# Patient Record
Sex: Female | Born: 1945
Health system: Southern US, Community
[De-identification: ages and names within clinical notes are randomized; demographics above are authoritative.]

## PROBLEM LIST (undated history)

## (undated) DIAGNOSIS — F32A Depression, unspecified: Secondary | ICD-10-CM

## (undated) DIAGNOSIS — I10 Essential (primary) hypertension: Secondary | ICD-10-CM

## (undated) DIAGNOSIS — M1990 Unspecified osteoarthritis, unspecified site: Secondary | ICD-10-CM

## (undated) DIAGNOSIS — R7303 Prediabetes: Secondary | ICD-10-CM

## (undated) DIAGNOSIS — L219 Seborrheic dermatitis, unspecified: Secondary | ICD-10-CM

## (undated) HISTORY — PX: TONSILLECTOMY: SUR1361

## (undated) HISTORY — PX: LUMBAR FUSION: SHX111

## (undated) HISTORY — DX: Essential (primary) hypertension: I10

## (undated) HISTORY — DX: Prediabetes: R73.03

---

## 1997-09-03 ENCOUNTER — Ambulatory Visit: Admission: RE | Admit: 1997-09-03 | Discharge: 1997-09-03 | Payer: Self-pay | Admitting: Family Medicine

## 2000-03-09 ENCOUNTER — Ambulatory Visit (HOSPITAL_BASED_OUTPATIENT_CLINIC_OR_DEPARTMENT_OTHER): Admission: RE | Admit: 2000-03-09 | Discharge: 2000-03-09 | Payer: Self-pay | Admitting: Orthopedic Surgery

## 2000-03-09 ENCOUNTER — Encounter (INDEPENDENT_AMBULATORY_CARE_PROVIDER_SITE_OTHER): Payer: Self-pay | Admitting: *Deleted

## 2000-08-03 ENCOUNTER — Encounter: Payer: Self-pay | Admitting: *Deleted

## 2000-08-03 ENCOUNTER — Encounter: Admission: RE | Admit: 2000-08-03 | Discharge: 2000-08-03 | Payer: Self-pay | Admitting: *Deleted

## 2000-12-21 ENCOUNTER — Ambulatory Visit (HOSPITAL_BASED_OUTPATIENT_CLINIC_OR_DEPARTMENT_OTHER): Admission: RE | Admit: 2000-12-21 | Discharge: 2000-12-21 | Payer: Self-pay | Admitting: *Deleted

## 2000-12-21 ENCOUNTER — Encounter (INDEPENDENT_AMBULATORY_CARE_PROVIDER_SITE_OTHER): Payer: Self-pay | Admitting: *Deleted

## 2001-11-21 ENCOUNTER — Encounter: Payer: Self-pay | Admitting: Orthopedic Surgery

## 2001-11-21 ENCOUNTER — Encounter: Admission: RE | Admit: 2001-11-21 | Discharge: 2001-11-21 | Payer: Self-pay | Admitting: Orthopedic Surgery

## 2001-11-22 ENCOUNTER — Encounter: Admission: RE | Admit: 2001-11-22 | Discharge: 2001-11-22 | Payer: Self-pay | Admitting: Family Medicine

## 2001-11-22 ENCOUNTER — Encounter: Payer: Self-pay | Admitting: Family Medicine

## 2001-12-06 ENCOUNTER — Encounter: Payer: Self-pay | Admitting: Orthopedic Surgery

## 2001-12-06 ENCOUNTER — Encounter: Admission: RE | Admit: 2001-12-06 | Discharge: 2001-12-06 | Payer: Self-pay | Admitting: Orthopedic Surgery

## 2001-12-13 ENCOUNTER — Ambulatory Visit (HOSPITAL_BASED_OUTPATIENT_CLINIC_OR_DEPARTMENT_OTHER): Admission: RE | Admit: 2001-12-13 | Discharge: 2001-12-13 | Payer: Self-pay | Admitting: Orthopedic Surgery

## 2001-12-23 ENCOUNTER — Encounter: Admission: RE | Admit: 2001-12-23 | Discharge: 2001-12-23 | Payer: Self-pay | Admitting: Orthopedic Surgery

## 2001-12-23 ENCOUNTER — Encounter: Payer: Self-pay | Admitting: Orthopedic Surgery

## 2002-07-07 ENCOUNTER — Encounter: Admission: RE | Admit: 2002-07-07 | Discharge: 2002-07-07 | Payer: Self-pay | Admitting: Family Medicine

## 2002-07-07 ENCOUNTER — Encounter: Payer: Self-pay | Admitting: Family Medicine

## 2002-12-11 ENCOUNTER — Encounter: Payer: Self-pay | Admitting: Family Medicine

## 2002-12-11 ENCOUNTER — Encounter: Admission: RE | Admit: 2002-12-11 | Discharge: 2002-12-11 | Payer: Self-pay | Admitting: Family Medicine

## 2004-02-21 ENCOUNTER — Encounter: Admission: RE | Admit: 2004-02-21 | Discharge: 2004-02-21 | Payer: Self-pay | Admitting: Family Medicine

## 2004-09-10 ENCOUNTER — Inpatient Hospital Stay (HOSPITAL_COMMUNITY): Admission: EM | Admit: 2004-09-10 | Discharge: 2004-09-20 | Payer: Self-pay | Admitting: Emergency Medicine

## 2004-09-10 ENCOUNTER — Ambulatory Visit: Payer: Self-pay | Admitting: Physical Medicine & Rehabilitation

## 2005-04-06 ENCOUNTER — Encounter: Admission: RE | Admit: 2005-04-06 | Discharge: 2005-04-06 | Payer: Self-pay | Admitting: Family Medicine

## 2005-08-13 ENCOUNTER — Ambulatory Visit: Payer: Self-pay | Admitting: Gastroenterology

## 2005-08-24 ENCOUNTER — Encounter (INDEPENDENT_AMBULATORY_CARE_PROVIDER_SITE_OTHER): Payer: Self-pay | Admitting: *Deleted

## 2005-08-24 ENCOUNTER — Ambulatory Visit: Payer: Self-pay | Admitting: Gastroenterology

## 2005-09-28 ENCOUNTER — Ambulatory Visit: Payer: Self-pay | Admitting: Gastroenterology

## 2005-10-05 ENCOUNTER — Ambulatory Visit (HOSPITAL_BASED_OUTPATIENT_CLINIC_OR_DEPARTMENT_OTHER): Admission: RE | Admit: 2005-10-05 | Discharge: 2005-10-05 | Payer: Self-pay | Admitting: Orthopedic Surgery

## 2006-04-21 ENCOUNTER — Encounter: Admission: RE | Admit: 2006-04-21 | Discharge: 2006-04-21 | Payer: Self-pay | Admitting: Family Medicine

## 2007-09-01 ENCOUNTER — Encounter: Admission: RE | Admit: 2007-09-01 | Discharge: 2007-09-01 | Payer: Self-pay | Admitting: Family Medicine

## 2007-11-22 ENCOUNTER — Encounter: Payer: Self-pay | Admitting: Gastroenterology

## 2007-12-19 DIAGNOSIS — K589 Irritable bowel syndrome without diarrhea: Secondary | ICD-10-CM | POA: Insufficient documentation

## 2007-12-19 DIAGNOSIS — K319 Disease of stomach and duodenum, unspecified: Secondary | ICD-10-CM | POA: Insufficient documentation

## 2007-12-19 DIAGNOSIS — K5730 Diverticulosis of large intestine without perforation or abscess without bleeding: Secondary | ICD-10-CM | POA: Insufficient documentation

## 2007-12-19 DIAGNOSIS — K219 Gastro-esophageal reflux disease without esophagitis: Secondary | ICD-10-CM | POA: Insufficient documentation

## 2007-12-19 DIAGNOSIS — Z87898 Personal history of other specified conditions: Secondary | ICD-10-CM | POA: Insufficient documentation

## 2007-12-19 DIAGNOSIS — K449 Diaphragmatic hernia without obstruction or gangrene: Secondary | ICD-10-CM | POA: Insufficient documentation

## 2007-12-20 ENCOUNTER — Ambulatory Visit: Payer: Self-pay | Admitting: Gastroenterology

## 2008-09-03 ENCOUNTER — Encounter: Admission: RE | Admit: 2008-09-03 | Discharge: 2008-09-03 | Payer: Self-pay | Admitting: Family Medicine

## 2009-05-22 ENCOUNTER — Other Ambulatory Visit: Admission: RE | Admit: 2009-05-22 | Discharge: 2009-05-22 | Payer: Self-pay | Admitting: Obstetrics and Gynecology

## 2009-09-04 ENCOUNTER — Encounter: Admission: RE | Admit: 2009-09-04 | Discharge: 2009-09-04 | Payer: Self-pay | Admitting: Internal Medicine

## 2010-06-25 ENCOUNTER — Other Ambulatory Visit: Payer: Self-pay | Admitting: Obstetrics and Gynecology

## 2010-08-05 ENCOUNTER — Other Ambulatory Visit: Payer: Self-pay | Admitting: Internal Medicine

## 2010-08-05 DIAGNOSIS — Z1231 Encounter for screening mammogram for malignant neoplasm of breast: Secondary | ICD-10-CM
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
## 2010-09-05 NOTE — Op Note (Signed)
NAME:  Mary Mcdowell, Mary Mcdowell                 ACCOUNT NO.:  [id number]   MEDICAL RECORD NO.:  [id number]          PATIENT TYPE:  AMB   LOCATION:  DSC                          FACILITY:  [facility]   PHYSICIAN:  Robert A. Thurston Hole, M.D. DATE OF BIRTH:  1946-02-10   DATE OF PROCEDURE:  10/05/2005  DATE OF DISCHARGE:                                 OPERATIVE REPORT   PREOPERATIVE DIAGNOSIS:  Left knee medial lateral meniscal tears with  chondromalacia and synovitis.   POSTOPERATIVE DIAGNOSIS:  Left knee medial lateral meniscal tears with  chondromalacia and synovitis.   PROCEDURES:  1.  Left knee examination under anesthesia, followed by arthroscopic partial      medial and lateral meniscectomies.  2.  Left knee chondroplasty with partial synovectomy.   SURGEON:  Elana Alm. Thurston Hole, M.D.   ASSISTANT:  Julien Girt, P.A.   ANESTHESIA:  General.   OPERATIVE TIME:  30 minutes.   COMPLICATIONS:  None.   INDICATIONS FOR PROCEDURE:  [name] is a 60-year woman who has had 3-4  months of increasing left knee pain with exam and MRI documenting medial  meniscus tear with chondromalacia and synovitis.  She has failed  conservative care and is now to undergo arthroscopy.   DESCRIPTION:  [name] was brought to the operating room on October 05, 2005,  placed on the operative table in supine position.  After an adequate level  of general anesthesia was obtained, her left knee was examined.  She had  full range of motion, knee stable to ligamentous exam. with normal patellar  tracking.  The knee was sterilely injected with 0.25% Marcaine with  epinephrine.  The left leg was then prepped using sterile DuraPrep and  draped using sterile technique.  Originally through an anterolateral portal,  the arthroscope with a pump attached was placed in through an anteromedial  portal and an arthroscopic probe was placed.  On initial inspection of the  medial compartment, she was found to have 75-80% grade 3  chondromalacia,  which was debrided.  Medial meniscus tear, posterior medial horn, of which  50% was resected back to a stable rim.  Intercondylar notch inspected.  Anterior and posterior cruciate ligaments were normal.  Lateral compartment  inspected, 25-30% grade 3 chondromalacia, which was debrided.  Lateral  meniscus showed a partial tear of 30%, posterolateral corner, and was  resected back to a stable rim.  Patellofemoral joint showed 25-30% grade 3  chondromalacia, which was debrided.  The patella tracked normally.  Moderate  synovitis in the medial and lateral gutters were debrided.  Otherwise this  was free of pathology.  After this was done, it was felt that all pathology  had been satisfactorily addressed.  Instruments were removed.  Portals were  closed with 3-0 nylon suture and injected with 0.25% Marcaine with  epinephrine and 4 mg morphine.  Sterile dressings applied and the patient  awakened and taken to the recovery room in stable condition.   FOLLOW-UP CARE:  [name] will be followed as an outpatient on Darvocet and  ibuprofen.  See me back in  the office in a week for sutures out and follow-  up.      Robert A. Thurston Hole, M.D.  Electronically Signed     RAW/MEDQ  D:  10/05/2005  T:  10/06/2005  Job:  [id number]
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
## 2010-09-05 NOTE — Op Note (Signed)
   NAME:  Mary Mcdowell, Mary Mcdowell                           ACCOUNT NO.:  [id number]   MEDICAL RECORD NO.:  [id number]                   PATIENT TYPE:  AMB   LOCATION:  DSC                                  FACILITY:  [facility]   PHYSICIAN:  Nicki Reaper, M.D.                 DATE OF BIRTH:  [birth year]   DATE OF PROCEDURE:  DATE OF DISCHARGE:                                 OPERATIVE REPORT   PREOPERATIVE DIAGNOSIS:  Carpal tunnel syndrome, left hand.   POSTOPERATIVE DIAGNOSIS:  Carpal tunnel syndrome, left hand.   OPERATION/PROCEDURE:  Decompression, left median nerve.   ANESTHESIA:  Forearm based IV regional.   HISTORY:  The patient is a 65 year old female with a history of carpal  tunnel syndrome. An electromyogram nerve conduction was positive, which has  not responded to conservative treatment.   DESCRIPTION OF PROCEDURE:  The patient was brought to the operating room  where a forearm based IV regional anesthetic was carried out without  difficulty. She was prepped and draped using Betadine scrub and solution on  the left arm.   A longitudinal incision was made in the palm and carried down through the  subcutaneous tissues. Bleeders were electrocauterized. The palmar fascia was  split. The superficial palmar arteries were identified. The flexor tendon to  the ring and little  finger were identified to the ulnar side of the  median  nerve. The carpal retinaculum was incised with sharp  dissection. A right  angle and Sewell retractor were placed between the skin and the forearm  fascia. The fascia was released for approximately 3 cm proximal to the wrist  crease under direct vision. The canal was explored. No further lesions were  identified.  The wound was irrigated.  The skin was closed with interrupted  5-0 nylon sutures.  A sterile compressive dressing and splint was applied.   The patient tolerated the procedure well.  She was taken to the recovery  room for observation in  satisfactory condition. She was discharged home to  an aunt in  [place] and one week on Talwin and Keflex.                                               Nicki Reaper, M.D.    GRK/MEDQ  D:  12/13/2001  T:  12/14/2001  Job:  [id number]

## 2010-09-05 NOTE — Op Note (Signed)
[place]  Patient:    Mary Mcdowell, BRESS Visit Number: [id number] MRN: [id number]          Service Type: DSU Location: [facility] Attending Physician:  Twana First Dictated by:   Elana Alm Thurston Hole, M.D. Proc. Date: 12/21/00 Admit Date:  12/21/2000                             Operative Report  PREOPERATIVE DIAGNOSIS:  Left posterior knee cyst.  POSTOPERATIVE DIAGNOSIS:  Left posterior knee lipoma.  PROCEDURE:  Left posterior knee lipoma excision.  SURGEON:  Elana Alm. Thurston Hole, M.D.  ASSISTANT:  Julien Girt, P.A.  ANESTHESIA:  General.  OPERATIVE TIME:  45 minutes.  COMPLICATIONS:  None.  INDICATIONS FOR PROCEDURE:  [name] is a 65 year old woman who has had six months of noticing a small palpable lesion in her left posterolateral knee region.  MRI has not definitively diagnosed this as a confluence, but she has persistent problems with this, and is now to undergo excision of this potential cyst versus lipoma.  DESCRIPTION OF PROCEDURE:  [name] is brought to the operating room on 12/21/00, placed under general anesthesia on a stretcher and carefully turned prone on the operating table.  All pressure points and genitalia well padded. Her left leg was prepped using sterile Betadine and draped using sterile technique.  Leg was exsanguinated, and a thigh tourniquet elevated to 350 mmHg.  Initially, through a 4 cm curvilinear incision based over the cyst area initial exposure was made.  The underlying subcutaneous tissues were incised in line with the skin incision.  The underlying regions on top of the fascia posteriorly showed a confluence of fatty tissue consistent with a small lipoma which was removed measuring 5 mm x 1 cm, and sent for pathologic review.  No other pathology noted in this area.  The fascia was opened posterolaterally to palpate and explore this area.  The perineal nerve carefully protected while this was done,  but there was no other evidence of any mass of any type.  After this was done, no other pathology was noted.  The wound was irrigated then closed using 2-0 Vicryl and 3-0 Prolene. Steri-Strips were applied.  The wound was injected with 0.25% Marcaine, sterile dressings were applied, and then the tourniquet had been released prior to this.  No excessive bleeding was encountered.  The patient then was turned supine, extubated, awakened, and taken to the recovery room in stable condition.  FOLLOWUP:  [name] will be followed as an outpatient, on Talwin NX for pain and Naprosyn.  See back in the office in a week for wound check and followup. Dictated by:   Elana Alm Thurston Hole, M.D. Attending Physician:  Twana First DD:  12/21/00 TD:  12/21/00 Job: [id number]
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
## 2010-09-09 ENCOUNTER — Ambulatory Visit
Admission: RE | Admit: 2010-09-09 | Discharge: 2010-09-09 | Disposition: A | Discharge status: Home / Self Care | Payer: Medicare Other | Source: Ambulatory Visit | Attending: Internal Medicine | Admitting: Internal Medicine

## 2010-09-09 DIAGNOSIS — Z1231 Encounter for screening mammogram for malignant neoplasm of breast: Secondary | ICD-10-CM

## 2010-09-23 ENCOUNTER — Encounter: Payer: Self-pay | Admitting: Gastroenterology

## 2011-03-26 ENCOUNTER — Other Ambulatory Visit: Payer: Self-pay | Admitting: Gastroenterology

## 2011-09-03 ENCOUNTER — Other Ambulatory Visit: Payer: Self-pay | Admitting: Internal Medicine

## 2011-09-03 DIAGNOSIS — Z1231 Encounter for screening mammogram for malignant neoplasm of breast: Secondary | ICD-10-CM

## 2011-09-23 ENCOUNTER — Ambulatory Visit
Admission: RE | Admit: 2011-09-23 | Discharge: 2011-09-23 | Disposition: A | Discharge status: Home / Self Care | Payer: Medicare Other | Source: Ambulatory Visit | Attending: Internal Medicine | Admitting: Internal Medicine

## 2011-09-23 DIAGNOSIS — Z1231 Encounter for screening mammogram for malignant neoplasm of breast: Secondary | ICD-10-CM

## 2012-01-08 ENCOUNTER — Encounter: Payer: Self-pay | Admitting: Gastroenterology

## 2012-01-14 ENCOUNTER — Telehealth: Payer: Self-pay | Admitting: Gastroenterology

## 2012-01-20 ENCOUNTER — Other Ambulatory Visit: Payer: Self-pay | Admitting: Obstetrics and Gynecology

## 2012-01-20 ENCOUNTER — Other Ambulatory Visit (HOSPITAL_COMMUNITY)
Admission: RE | Admit: 2012-01-20 | Discharge: 2012-01-20 | Disposition: A | Discharge status: Home / Self Care | Payer: Medicare Other | Source: Ambulatory Visit | Attending: Obstetrics and Gynecology | Admitting: Obstetrics and Gynecology

## 2012-01-20 DIAGNOSIS — Z124 Encounter for screening for malignant neoplasm of cervix: Secondary | ICD-10-CM | POA: Insufficient documentation

## 2012-02-10 NOTE — Telephone Encounter (Signed)
pt received recall letter for colon.  Says she has moved care to [facility]

## 2012-09-01 ENCOUNTER — Other Ambulatory Visit: Payer: Self-pay

## 2012-09-01 DIAGNOSIS — Z1231 Encounter for screening mammogram for malignant neoplasm of breast: Secondary | ICD-10-CM

## 2012-10-05 ENCOUNTER — Ambulatory Visit
Admission: RE | Admit: 2012-10-05 | Discharge: 2012-10-05 | Disposition: A | Discharge status: Home / Self Care | Payer: Medicare Other | Source: Ambulatory Visit

## 2012-10-05 DIAGNOSIS — Z1231 Encounter for screening mammogram for malignant neoplasm of breast: Secondary | ICD-10-CM

## 2013-09-19 ENCOUNTER — Other Ambulatory Visit: Payer: Self-pay

## 2013-09-19 DIAGNOSIS — Z1231 Encounter for screening mammogram for malignant neoplasm of breast: Secondary | ICD-10-CM

## 2013-10-06 ENCOUNTER — Ambulatory Visit
Admission: RE | Admit: 2013-10-06 | Discharge: 2013-10-06 | Disposition: A | Discharge status: Home / Self Care | Payer: Medicare Other | Source: Ambulatory Visit

## 2013-10-06 DIAGNOSIS — Z1231 Encounter for screening mammogram for malignant neoplasm of breast: Secondary | ICD-10-CM

## 2014-09-28 ENCOUNTER — Other Ambulatory Visit: Payer: Self-pay

## 2014-09-28 DIAGNOSIS — Z1231 Encounter for screening mammogram for malignant neoplasm of breast: Secondary | ICD-10-CM

## 2014-10-19 ENCOUNTER — Other Ambulatory Visit: Payer: Self-pay | Admitting: Internal Medicine

## 2014-10-19 DIAGNOSIS — E2839 Other primary ovarian failure: Secondary | ICD-10-CM

## 2014-11-02 ENCOUNTER — Ambulatory Visit
Admission: RE | Admit: 2014-11-02 | Discharge: 2014-11-02 | Disposition: A | Discharge status: Home / Self Care | Payer: Medicare Other | Source: Ambulatory Visit | Attending: Internal Medicine | Admitting: Internal Medicine

## 2014-11-02 ENCOUNTER — Ambulatory Visit
Admission: RE | Admit: 2014-11-02 | Discharge: 2014-11-02 | Disposition: A | Discharge status: Home / Self Care | Payer: Medicare Other | Source: Ambulatory Visit

## 2014-11-02 DIAGNOSIS — E2839 Other primary ovarian failure: Secondary | ICD-10-CM

## 2014-11-02 DIAGNOSIS — Z1231 Encounter for screening mammogram for malignant neoplasm of breast: Secondary | ICD-10-CM

## 2014-12-13 ENCOUNTER — Encounter: Payer: Self-pay | Admitting: Gastroenterology

## 2015-09-21 DIAGNOSIS — M542 Cervicalgia: Secondary | ICD-10-CM | POA: Diagnosis not present

## 2015-09-21 DIAGNOSIS — G629 Polyneuropathy, unspecified: Secondary | ICD-10-CM | POA: Diagnosis not present

## 2015-10-25 DIAGNOSIS — J301 Allergic rhinitis due to pollen: Secondary | ICD-10-CM | POA: Diagnosis not present

## 2015-10-30 ENCOUNTER — Other Ambulatory Visit: Payer: Self-pay | Admitting: Internal Medicine

## 2015-10-30 DIAGNOSIS — Z1231 Encounter for screening mammogram for malignant neoplasm of breast: Secondary | ICD-10-CM

## 2015-11-01 DIAGNOSIS — L219 Seborrheic dermatitis, unspecified: Secondary | ICD-10-CM | POA: Diagnosis not present

## 2015-11-08 DIAGNOSIS — H52223 Regular astigmatism, bilateral: Secondary | ICD-10-CM | POA: Diagnosis not present

## 2015-11-15 ENCOUNTER — Ambulatory Visit
Admission: RE | Admit: 2015-11-15 | Discharge: 2015-11-15 | Disposition: A | Discharge status: Home / Self Care | Payer: Medicare Other | Source: Ambulatory Visit | Attending: Internal Medicine | Admitting: Internal Medicine

## 2015-11-15 DIAGNOSIS — Z1231 Encounter for screening mammogram for malignant neoplasm of breast: Secondary | ICD-10-CM | POA: Diagnosis not present

## 2015-11-21 DIAGNOSIS — M25562 Pain in left knee: Secondary | ICD-10-CM | POA: Diagnosis not present

## 2015-12-06 DIAGNOSIS — J309 Allergic rhinitis, unspecified: Secondary | ICD-10-CM | POA: Diagnosis not present

## 2015-12-06 DIAGNOSIS — F322 Major depressive disorder, single episode, severe without psychotic features: Secondary | ICD-10-CM | POA: Diagnosis not present

## 2015-12-06 DIAGNOSIS — Z1389 Encounter for screening for other disorder: Secondary | ICD-10-CM | POA: Diagnosis not present

## 2015-12-06 DIAGNOSIS — Z0000 Encounter for general adult medical examination without abnormal findings: Secondary | ICD-10-CM | POA: Diagnosis not present

## 2015-12-06 DIAGNOSIS — E559 Vitamin D deficiency, unspecified: Secondary | ICD-10-CM | POA: Diagnosis not present

## 2015-12-06 DIAGNOSIS — R7309 Other abnormal glucose: Secondary | ICD-10-CM | POA: Diagnosis not present

## 2015-12-06 DIAGNOSIS — E782 Mixed hyperlipidemia: Secondary | ICD-10-CM | POA: Diagnosis not present

## 2015-12-06 DIAGNOSIS — M810 Age-related osteoporosis without current pathological fracture: Secondary | ICD-10-CM | POA: Diagnosis not present

## 2015-12-06 DIAGNOSIS — G629 Polyneuropathy, unspecified: Secondary | ICD-10-CM | POA: Diagnosis not present

## 2015-12-06 DIAGNOSIS — Z1159 Encounter for screening for other viral diseases: Secondary | ICD-10-CM | POA: Diagnosis not present

## 2016-01-24 DIAGNOSIS — Z23 Encounter for immunization: Secondary | ICD-10-CM | POA: Diagnosis not present

## 2016-05-01 DIAGNOSIS — L820 Inflamed seborrheic keratosis: Secondary | ICD-10-CM | POA: Diagnosis not present

## 2016-05-01 DIAGNOSIS — L821 Other seborrheic keratosis: Secondary | ICD-10-CM | POA: Diagnosis not present

## 2016-05-01 DIAGNOSIS — D1801 Hemangioma of skin and subcutaneous tissue: Secondary | ICD-10-CM | POA: Diagnosis not present

## 2016-05-01 DIAGNOSIS — L814 Other melanin hyperpigmentation: Secondary | ICD-10-CM | POA: Diagnosis not present

## 2016-05-01 DIAGNOSIS — L280 Lichen simplex chronicus: Secondary | ICD-10-CM | POA: Diagnosis not present

## 2016-06-19 DIAGNOSIS — L900 Lichen sclerosus et atrophicus: Secondary | ICD-10-CM | POA: Diagnosis not present

## 2016-11-10 ENCOUNTER — Other Ambulatory Visit: Payer: Self-pay | Admitting: Internal Medicine

## 2016-11-10 DIAGNOSIS — Z1231 Encounter for screening mammogram for malignant neoplasm of breast: Secondary | ICD-10-CM

## 2016-11-20 ENCOUNTER — Ambulatory Visit: Payer: Medicare Other

## 2016-11-27 ENCOUNTER — Ambulatory Visit
Admission: RE | Admit: 2016-11-27 | Discharge: 2016-11-27 | Disposition: A | Discharge status: Home / Self Care | Payer: Medicare Other | Source: Ambulatory Visit | Attending: Internal Medicine | Admitting: Internal Medicine

## 2016-11-27 DIAGNOSIS — Z1231 Encounter for screening mammogram for malignant neoplasm of breast: Secondary | ICD-10-CM | POA: Diagnosis not present

## 2016-12-11 ENCOUNTER — Other Ambulatory Visit: Payer: Self-pay | Admitting: Internal Medicine

## 2016-12-11 DIAGNOSIS — Z0000 Encounter for general adult medical examination without abnormal findings: Secondary | ICD-10-CM | POA: Diagnosis not present

## 2016-12-11 DIAGNOSIS — M810 Age-related osteoporosis without current pathological fracture: Secondary | ICD-10-CM | POA: Diagnosis not present

## 2016-12-11 DIAGNOSIS — K219 Gastro-esophageal reflux disease without esophagitis: Secondary | ICD-10-CM | POA: Diagnosis not present

## 2016-12-11 DIAGNOSIS — E782 Mixed hyperlipidemia: Secondary | ICD-10-CM | POA: Diagnosis not present

## 2016-12-18 DIAGNOSIS — K5730 Diverticulosis of large intestine without perforation or abscess without bleeding: Secondary | ICD-10-CM | POA: Diagnosis not present

## 2016-12-18 DIAGNOSIS — Z86010 Personal history of colonic polyps: Secondary | ICD-10-CM | POA: Diagnosis not present

## 2016-12-18 DIAGNOSIS — K640 First degree hemorrhoids: Secondary | ICD-10-CM | POA: Diagnosis not present

## 2016-12-25 ENCOUNTER — Ambulatory Visit
Admission: RE | Admit: 2016-12-25 | Discharge: 2016-12-25 | Disposition: A | Discharge status: Home / Self Care | Payer: Medicare Other | Source: Ambulatory Visit | Attending: Internal Medicine | Admitting: Internal Medicine

## 2016-12-25 DIAGNOSIS — M810 Age-related osteoporosis without current pathological fracture: Secondary | ICD-10-CM | POA: Diagnosis not present

## 2016-12-25 DIAGNOSIS — Z780 Asymptomatic menopausal state: Secondary | ICD-10-CM | POA: Diagnosis not present

## 2017-01-28 DIAGNOSIS — Z23 Encounter for immunization: Secondary | ICD-10-CM | POA: Diagnosis not present

## 2017-02-01 DIAGNOSIS — S46012A Strain of muscle(s) and tendon(s) of the rotator cuff of left shoulder, initial encounter: Secondary | ICD-10-CM | POA: Diagnosis not present

## 2017-02-01 DIAGNOSIS — M1712 Unilateral primary osteoarthritis, left knee: Secondary | ICD-10-CM | POA: Diagnosis not present

## 2017-02-26 DIAGNOSIS — H52223 Regular astigmatism, bilateral: Secondary | ICD-10-CM | POA: Diagnosis not present

## 2017-03-19 DIAGNOSIS — I10 Essential (primary) hypertension: Secondary | ICD-10-CM | POA: Diagnosis not present

## 2017-05-21 DIAGNOSIS — L900 Lichen sclerosus et atrophicus: Secondary | ICD-10-CM | POA: Diagnosis not present

## 2017-05-21 DIAGNOSIS — N814 Uterovaginal prolapse, unspecified: Secondary | ICD-10-CM | POA: Diagnosis not present

## 2017-05-21 DIAGNOSIS — N8111 Cystocele, midline: Secondary | ICD-10-CM | POA: Diagnosis not present

## 2017-05-21 DIAGNOSIS — Z01411 Encounter for gynecological examination (general) (routine) with abnormal findings: Secondary | ICD-10-CM | POA: Diagnosis not present

## 2017-06-21 DIAGNOSIS — F322 Major depressive disorder, single episode, severe without psychotic features: Secondary | ICD-10-CM | POA: Diagnosis not present

## 2017-06-21 DIAGNOSIS — K219 Gastro-esophageal reflux disease without esophagitis: Secondary | ICD-10-CM | POA: Diagnosis not present

## 2017-06-21 DIAGNOSIS — M810 Age-related osteoporosis without current pathological fracture: Secondary | ICD-10-CM | POA: Diagnosis not present

## 2017-06-21 DIAGNOSIS — I10 Essential (primary) hypertension: Secondary | ICD-10-CM | POA: Diagnosis not present

## 2017-07-19 DIAGNOSIS — M170 Bilateral primary osteoarthritis of knee: Secondary | ICD-10-CM | POA: Diagnosis not present

## 2017-09-20 DIAGNOSIS — K219 Gastro-esophageal reflux disease without esophagitis: Secondary | ICD-10-CM | POA: Diagnosis not present

## 2017-09-20 DIAGNOSIS — M810 Age-related osteoporosis without current pathological fracture: Secondary | ICD-10-CM | POA: Diagnosis not present

## 2017-09-20 DIAGNOSIS — I10 Essential (primary) hypertension: Secondary | ICD-10-CM | POA: Diagnosis not present

## 2017-09-20 DIAGNOSIS — F322 Major depressive disorder, single episode, severe without psychotic features: Secondary | ICD-10-CM | POA: Diagnosis not present

## 2017-10-26 ENCOUNTER — Other Ambulatory Visit: Payer: Self-pay | Admitting: Internal Medicine

## 2017-10-26 DIAGNOSIS — Z1231 Encounter for screening mammogram for malignant neoplasm of breast: Secondary | ICD-10-CM

## 2017-11-23 DIAGNOSIS — M170 Bilateral primary osteoarthritis of knee: Secondary | ICD-10-CM | POA: Diagnosis not present

## 2017-11-29 ENCOUNTER — Ambulatory Visit
Admission: RE | Admit: 2017-11-29 | Discharge: 2017-11-29 | Disposition: A | Discharge status: Home / Self Care | Payer: Medicare Other | Source: Ambulatory Visit | Attending: Internal Medicine | Admitting: Internal Medicine

## 2017-11-29 DIAGNOSIS — Z1231 Encounter for screening mammogram for malignant neoplasm of breast: Secondary | ICD-10-CM | POA: Diagnosis not present

## 2017-11-30 ENCOUNTER — Other Ambulatory Visit: Payer: Self-pay | Admitting: Internal Medicine

## 2017-11-30 DIAGNOSIS — R928 Other abnormal and inconclusive findings on diagnostic imaging of breast: Secondary | ICD-10-CM

## 2017-12-01 DIAGNOSIS — M170 Bilateral primary osteoarthritis of knee: Secondary | ICD-10-CM | POA: Diagnosis not present

## 2017-12-06 ENCOUNTER — Ambulatory Visit
Admission: RE | Admit: 2017-12-06 | Discharge: 2017-12-06 | Disposition: A | Discharge status: Home / Self Care | Payer: Medicare Other | Source: Ambulatory Visit | Attending: Internal Medicine | Admitting: Internal Medicine

## 2017-12-06 ENCOUNTER — Other Ambulatory Visit: Payer: Self-pay | Admitting: Internal Medicine

## 2017-12-06 DIAGNOSIS — N6320 Unspecified lump in the left breast, unspecified quadrant: Secondary | ICD-10-CM | POA: Diagnosis not present

## 2017-12-06 DIAGNOSIS — R928 Other abnormal and inconclusive findings on diagnostic imaging of breast: Secondary | ICD-10-CM

## 2017-12-06 DIAGNOSIS — N6489 Other specified disorders of breast: Secondary | ICD-10-CM

## 2017-12-06 DIAGNOSIS — R922 Inconclusive mammogram: Secondary | ICD-10-CM | POA: Diagnosis not present

## 2017-12-08 DIAGNOSIS — M170 Bilateral primary osteoarthritis of knee: Secondary | ICD-10-CM | POA: Diagnosis not present

## 2017-12-15 DIAGNOSIS — M170 Bilateral primary osteoarthritis of knee: Secondary | ICD-10-CM | POA: Diagnosis not present

## 2017-12-22 ENCOUNTER — Other Ambulatory Visit: Payer: Self-pay | Admitting: Internal Medicine

## 2018-01-13 DIAGNOSIS — Z23 Encounter for immunization: Secondary | ICD-10-CM | POA: Diagnosis not present

## 2018-02-28 DIAGNOSIS — H524 Presbyopia: Secondary | ICD-10-CM | POA: Diagnosis not present

## 2018-03-16 DIAGNOSIS — I10 Essential (primary) hypertension: Secondary | ICD-10-CM | POA: Diagnosis not present

## 2018-03-16 DIAGNOSIS — E782 Mixed hyperlipidemia: Secondary | ICD-10-CM | POA: Diagnosis not present

## 2018-03-16 DIAGNOSIS — K219 Gastro-esophageal reflux disease without esophagitis: Secondary | ICD-10-CM | POA: Diagnosis not present

## 2018-03-16 DIAGNOSIS — M810 Age-related osteoporosis without current pathological fracture: Secondary | ICD-10-CM | POA: Diagnosis not present

## 2018-04-25 DIAGNOSIS — M542 Cervicalgia: Secondary | ICD-10-CM | POA: Diagnosis not present

## 2018-04-25 DIAGNOSIS — S46011A Strain of muscle(s) and tendon(s) of the rotator cuff of right shoulder, initial encounter: Secondary | ICD-10-CM | POA: Diagnosis not present

## 2018-05-05 DIAGNOSIS — L218 Other seborrheic dermatitis: Secondary | ICD-10-CM | POA: Diagnosis not present

## 2018-05-27 DIAGNOSIS — L900 Lichen sclerosus et atrophicus: Secondary | ICD-10-CM | POA: Diagnosis not present

## 2018-05-27 DIAGNOSIS — N8111 Cystocele, midline: Secondary | ICD-10-CM | POA: Diagnosis not present

## 2018-05-27 DIAGNOSIS — Z01419 Encounter for gynecological examination (general) (routine) without abnormal findings: Secondary | ICD-10-CM | POA: Diagnosis not present

## 2018-05-27 DIAGNOSIS — N814 Uterovaginal prolapse, unspecified: Secondary | ICD-10-CM | POA: Diagnosis not present

## 2018-06-13 ENCOUNTER — Ambulatory Visit
Admission: RE | Admit: 2018-06-13 | Discharge: 2018-06-13 | Disposition: A | Discharge status: Home / Self Care | Payer: Medicare Other | Source: Ambulatory Visit | Attending: Internal Medicine | Admitting: Internal Medicine

## 2018-06-13 DIAGNOSIS — R922 Inconclusive mammogram: Secondary | ICD-10-CM | POA: Diagnosis not present

## 2018-06-13 DIAGNOSIS — N6489 Other specified disorders of breast: Secondary | ICD-10-CM

## 2018-06-14 DIAGNOSIS — L218 Other seborrheic dermatitis: Secondary | ICD-10-CM | POA: Diagnosis not present

## 2018-06-29 DIAGNOSIS — H0102A Squamous blepharitis right eye, upper and lower eyelids: Secondary | ICD-10-CM | POA: Diagnosis not present

## 2018-06-29 DIAGNOSIS — H0014 Chalazion left upper eyelid: Secondary | ICD-10-CM | POA: Diagnosis not present

## 2018-06-29 DIAGNOSIS — H0102B Squamous blepharitis left eye, upper and lower eyelids: Secondary | ICD-10-CM | POA: Diagnosis not present

## 2018-06-29 DIAGNOSIS — H04123 Dry eye syndrome of bilateral lacrimal glands: Secondary | ICD-10-CM | POA: Diagnosis not present

## 2018-08-22 DIAGNOSIS — I10 Essential (primary) hypertension: Secondary | ICD-10-CM | POA: Diagnosis not present

## 2018-08-22 DIAGNOSIS — M810 Age-related osteoporosis without current pathological fracture: Secondary | ICD-10-CM | POA: Diagnosis not present

## 2018-08-22 DIAGNOSIS — F322 Major depressive disorder, single episode, severe without psychotic features: Secondary | ICD-10-CM | POA: Diagnosis not present

## 2018-08-22 DIAGNOSIS — E559 Vitamin D deficiency, unspecified: Secondary | ICD-10-CM | POA: Diagnosis not present

## 2018-08-26 DIAGNOSIS — Z20828 Contact with and (suspected) exposure to other viral communicable diseases: Secondary | ICD-10-CM | POA: Diagnosis not present

## 2018-10-26 ENCOUNTER — Other Ambulatory Visit: Payer: Self-pay | Admitting: Internal Medicine

## 2018-10-26 DIAGNOSIS — Z9289 Personal history of other medical treatment: Secondary | ICD-10-CM

## 2018-11-03 DIAGNOSIS — N949 Unspecified condition associated with female genital organs and menstrual cycle: Secondary | ICD-10-CM | POA: Diagnosis not present

## 2018-11-03 DIAGNOSIS — R350 Frequency of micturition: Secondary | ICD-10-CM | POA: Diagnosis not present

## 2018-11-03 DIAGNOSIS — R3989 Other symptoms and signs involving the genitourinary system: Secondary | ICD-10-CM | POA: Diagnosis not present

## 2018-11-03 DIAGNOSIS — R1030 Lower abdominal pain, unspecified: Secondary | ICD-10-CM | POA: Diagnosis not present

## 2018-11-08 DIAGNOSIS — R350 Frequency of micturition: Secondary | ICD-10-CM | POA: Diagnosis not present

## 2018-11-29 DIAGNOSIS — R7303 Prediabetes: Secondary | ICD-10-CM | POA: Diagnosis not present

## 2018-11-29 DIAGNOSIS — J309 Allergic rhinitis, unspecified: Secondary | ICD-10-CM | POA: Diagnosis not present

## 2018-11-29 DIAGNOSIS — F322 Major depressive disorder, single episode, severe without psychotic features: Secondary | ICD-10-CM | POA: Diagnosis not present

## 2018-11-29 DIAGNOSIS — I10 Essential (primary) hypertension: Secondary | ICD-10-CM | POA: Diagnosis not present

## 2018-12-08 ENCOUNTER — Other Ambulatory Visit: Payer: Self-pay

## 2018-12-08 ENCOUNTER — Ambulatory Visit
Admission: RE | Admit: 2018-12-08 | Discharge: 2018-12-08 | Disposition: A | Discharge status: Home / Self Care | Payer: Medicare Other | Source: Ambulatory Visit | Attending: Internal Medicine | Admitting: Internal Medicine

## 2018-12-08 DIAGNOSIS — Z9289 Personal history of other medical treatment: Secondary | ICD-10-CM

## 2018-12-08 DIAGNOSIS — Z1231 Encounter for screening mammogram for malignant neoplasm of breast: Secondary | ICD-10-CM | POA: Diagnosis not present

## 2019-01-02 DIAGNOSIS — M170 Bilateral primary osteoarthritis of knee: Secondary | ICD-10-CM | POA: Diagnosis not present

## 2019-01-09 DIAGNOSIS — M1611 Unilateral primary osteoarthritis, right hip: Secondary | ICD-10-CM | POA: Diagnosis not present

## 2019-01-09 DIAGNOSIS — M170 Bilateral primary osteoarthritis of knee: Secondary | ICD-10-CM | POA: Diagnosis not present

## 2019-01-16 DIAGNOSIS — M170 Bilateral primary osteoarthritis of knee: Secondary | ICD-10-CM | POA: Diagnosis not present

## 2019-01-25 DIAGNOSIS — Z23 Encounter for immunization: Secondary | ICD-10-CM | POA: Diagnosis not present

## 2019-02-01 DIAGNOSIS — K5900 Constipation, unspecified: Secondary | ICD-10-CM | POA: Diagnosis not present

## 2019-02-01 DIAGNOSIS — M169 Osteoarthritis of hip, unspecified: Secondary | ICD-10-CM | POA: Diagnosis not present

## 2019-02-27 DIAGNOSIS — M170 Bilateral primary osteoarthritis of knee: Secondary | ICD-10-CM | POA: Diagnosis not present

## 2019-03-07 DIAGNOSIS — F322 Major depressive disorder, single episode, severe without psychotic features: Secondary | ICD-10-CM | POA: Diagnosis not present

## 2019-03-07 DIAGNOSIS — Z1389 Encounter for screening for other disorder: Secondary | ICD-10-CM | POA: Diagnosis not present

## 2019-03-07 DIAGNOSIS — L439 Lichen planus, unspecified: Secondary | ICD-10-CM | POA: Diagnosis not present

## 2019-03-07 DIAGNOSIS — E782 Mixed hyperlipidemia: Secondary | ICD-10-CM | POA: Diagnosis not present

## 2019-03-07 DIAGNOSIS — I10 Essential (primary) hypertension: Secondary | ICD-10-CM | POA: Diagnosis not present

## 2019-03-07 DIAGNOSIS — Z0000 Encounter for general adult medical examination without abnormal findings: Secondary | ICD-10-CM | POA: Diagnosis not present

## 2019-04-12 DIAGNOSIS — Z961 Presence of intraocular lens: Secondary | ICD-10-CM | POA: Diagnosis not present

## 2019-04-12 DIAGNOSIS — H04123 Dry eye syndrome of bilateral lacrimal glands: Secondary | ICD-10-CM | POA: Diagnosis not present

## 2019-04-12 DIAGNOSIS — E119 Type 2 diabetes mellitus without complications: Secondary | ICD-10-CM | POA: Diagnosis not present

## 2019-04-12 DIAGNOSIS — H40013 Open angle with borderline findings, low risk, bilateral: Secondary | ICD-10-CM | POA: Diagnosis not present

## 2019-06-12 DIAGNOSIS — M170 Bilateral primary osteoarthritis of knee: Secondary | ICD-10-CM | POA: Diagnosis not present

## 2019-06-28 DIAGNOSIS — I10 Essential (primary) hypertension: Secondary | ICD-10-CM | POA: Diagnosis not present

## 2019-06-28 DIAGNOSIS — M810 Age-related osteoporosis without current pathological fracture: Secondary | ICD-10-CM | POA: Diagnosis not present

## 2019-06-28 DIAGNOSIS — F322 Major depressive disorder, single episode, severe without psychotic features: Secondary | ICD-10-CM | POA: Diagnosis not present

## 2019-06-28 DIAGNOSIS — E782 Mixed hyperlipidemia: Secondary | ICD-10-CM | POA: Diagnosis not present

## 2019-07-12 DIAGNOSIS — K5900 Constipation, unspecified: Secondary | ICD-10-CM | POA: Diagnosis not present

## 2019-07-12 DIAGNOSIS — K649 Unspecified hemorrhoids: Secondary | ICD-10-CM | POA: Diagnosis not present

## 2019-07-20 DIAGNOSIS — N8111 Cystocele, midline: Secondary | ICD-10-CM | POA: Diagnosis not present

## 2019-07-20 DIAGNOSIS — L900 Lichen sclerosus et atrophicus: Secondary | ICD-10-CM | POA: Diagnosis not present

## 2019-07-20 DIAGNOSIS — N814 Uterovaginal prolapse, unspecified: Secondary | ICD-10-CM | POA: Diagnosis not present

## 2019-07-20 DIAGNOSIS — Z01419 Encounter for gynecological examination (general) (routine) without abnormal findings: Secondary | ICD-10-CM | POA: Diagnosis not present

## 2019-08-09 DIAGNOSIS — N8111 Cystocele, midline: Secondary | ICD-10-CM | POA: Diagnosis not present

## 2019-08-09 DIAGNOSIS — N814 Uterovaginal prolapse, unspecified: Secondary | ICD-10-CM | POA: Diagnosis not present

## 2019-08-21 DIAGNOSIS — K5900 Constipation, unspecified: Secondary | ICD-10-CM | POA: Diagnosis not present

## 2019-08-21 DIAGNOSIS — K649 Unspecified hemorrhoids: Secondary | ICD-10-CM | POA: Diagnosis not present

## 2019-08-22 DIAGNOSIS — N814 Uterovaginal prolapse, unspecified: Secondary | ICD-10-CM | POA: Diagnosis not present

## 2019-08-22 DIAGNOSIS — N8111 Cystocele, midline: Secondary | ICD-10-CM | POA: Diagnosis not present

## 2019-09-05 DIAGNOSIS — I10 Essential (primary) hypertension: Secondary | ICD-10-CM | POA: Diagnosis not present

## 2019-09-05 DIAGNOSIS — E782 Mixed hyperlipidemia: Secondary | ICD-10-CM | POA: Diagnosis not present

## 2019-09-05 DIAGNOSIS — F322 Major depressive disorder, single episode, severe without psychotic features: Secondary | ICD-10-CM | POA: Diagnosis not present

## 2019-09-05 DIAGNOSIS — M810 Age-related osteoporosis without current pathological fracture: Secondary | ICD-10-CM | POA: Diagnosis not present

## 2019-09-06 ENCOUNTER — Other Ambulatory Visit: Payer: Self-pay | Admitting: Internal Medicine

## 2019-09-06 DIAGNOSIS — M810 Age-related osteoporosis without current pathological fracture: Secondary | ICD-10-CM

## 2019-09-06 DIAGNOSIS — Z1231 Encounter for screening mammogram for malignant neoplasm of breast: Secondary | ICD-10-CM

## 2019-09-20 ENCOUNTER — Encounter: Payer: Self-pay | Admitting: Vascular Surgery

## 2019-09-20 ENCOUNTER — Ambulatory Visit: Payer: Medicare Other | Admitting: Vascular Surgery

## 2019-09-20 ENCOUNTER — Other Ambulatory Visit: Payer: Self-pay

## 2019-09-20 VITALS — BP 145/75 | HR 65 | Temp 98.2°F | Resp 18 | Ht 62.5 in | Wt 131.6 lb

## 2019-09-20 DIAGNOSIS — I83813 Varicose veins of bilateral lower extremities with pain: Secondary | ICD-10-CM | POA: Diagnosis not present

## 2019-09-20 NOTE — Progress Notes (Signed)
Referring Physician:Karrar Lysle Rubens  Patient name: Mary Mcdowell MRN: [id number] DOB: May 02, 1945 Sex: female  REASON FOR CONSULT: Left foot pain with varicose veins  HPI: Mary Mcdowell is a 74 y.o. female, with a history of pain in the left foot that has been going on for about 1 month.  She states the left foot foot when she places shoes on her foot.  She does not have any pain if she displaces house slippers.  She has a small amount of swelling according to her primary care physician but she states she has not really noticed this.  She has no history of DVT.  She has no family history of varicose veins.  She has not worn compression stockings in the past.    Past Medical History:  Diagnosis Date   Hypertension    Pre-diabetes    Past Surgical History:  Procedure Laterality Date   LUMBAR FUSION     TONSILLECTOMY      Family History  Problem Relation Age of Onset   Breast cancer Sister     SOCIAL HISTORY: Social History   Socioeconomic History   Marital status: Married    Spouse name: Not on file   Number of children: Not on file   Years of education: Not on file   Highest education level: Not on file  Occupational History   Not on file  Tobacco Use   Smoking status: Former Smoker   Smokeless tobacco: Never Used  Substance and Sexual Activity   Alcohol use: Never   Drug use: Never   Sexual activity: Not on file  Other Topics Concern   Not on file  Social History Narrative   Not on file   Social Determinants of Health   Financial Resource Strain:    Difficulty of Paying Living Expenses:   Food Insecurity:    Worried About Charity fundraiser in the Last Year:    Arboriculturist in the Last Year:   Transportation Needs:    Film/video editor (Medical):    Lack of Transportation (Non-Medical):   Physical Activity:    Days of Exercise per Week:    Minutes of Exercise per Session:   Stress:    Feeling of Stress :   Social  Connections:    Frequency of Communication with Friends and Family:    Frequency of Social Gatherings with Friends and Family:    Attends Religious Services:    Active Member of Clubs or Organizations:    Attends Archivist Meetings:    Marital Status:   Intimate Partner Violence:    Fear of Current or Ex-Partner:    Emotionally Abused:    Physically Abused:    Sexually Abused:     Allergies  Allergen Reactions   Codeine     REACTION: nausea/vomiting    Current Outpatient Medications  Medication Sig Dispense Refill   alendronate (FOSAMAX) 70 MG tablet Take 70 mg by mouth once a week.     amLODipine (NORVASC) 5 MG tablet Take 5 mg by mouth daily.     hydrocortisone 2.5 % cream APPLY TWICE DAILY AS NEEDED FOR HEMORRHOID BLEEDING     meclizine (ANTIVERT) 25 MG tablet Take 25 mg by mouth 2 (two) times daily as needed for dizziness.     meloxicam (MOBIC) 7.5 MG tablet Take 7.5 mg by mouth daily.     pregabalin (LYRICA) 150 MG capsule TAKE ONE CAPSULE BY MOUTH AT BEDTIME. GENERIC  EQUIVALENT FOR LYRICA     psyllium (METAMUCIL) 58.6 % powder Take 1 packet by mouth daily.     venlafaxine XR (EFFEXOR-XR) 75 MG 24 hr capsule TAKE 1 CAPSULE BY MOUTH DAILY WITH FOOD     Vitamin D, Ergocalciferol, (DRISDOL) 1.25 MG (50000 UNIT) CAPS capsule Take 50,000 Units by mouth once a week.     No current facility-administered medications for this visit.    ROS:   General:  No weight loss, Fever, chills  HEENT: No recent headaches, no nasal bleeding, no visual changes, no sore throat  Neurologic: No dizziness, blackouts, seizures. No recent symptoms of stroke or mini- stroke. No recent episodes of slurred speech, or temporary blindness.  Cardiac: No recent episodes of chest pain/pressure, no shortness of breath at rest.  No shortness of breath with exertion.  Denies history of atrial fibrillation or irregular heartbeat  Vascular: No history of rest pain in feet.  No  history of claudication.  No history of non-healing ulcer, No history of DVT   Pulmonary: No home oxygen, no productive cough, no hemoptysis,  No asthma or wheezing  Musculoskeletal:  [ ]  Arthritis, [ ]  Low back pain,  [ ]  Joint pain  Hematologic:No history of hypercoagulable state.  No history of easy bleeding.  No history of anemia  Gastrointestinal: No hematochezia or melena,  No gastroesophageal reflux, no trouble swallowing  Urinary: [ ]  chronic Kidney disease, [ ]  on HD - [ ]  MWF or [ ]  TTHS, [ ]  Burning with urination, [ ]  Frequent urination, [ ]  Difficulty urinating;   Skin: No rashes  Psychological: No history of anxiety,  No history of depression   Physical Examination  Vitals:   09/20/19 1431  BP: (!) 145/75  Pulse: 65  Resp: 18  Temp: 98.2 F (36.8 C)  TempSrc: Temporal  SpO2: 98%  Weight: 131 lb 9.6 oz (59.7 kg)  Height: 5' 2.5" (1.588 m)    Body mass index is 23.69 kg/m.  General:  Alert and oriented, no acute distress HEENT: Normal Neck: No JVD Cardiac: Regular Rate and Rhythm Skin: No rash, scattered varicosities diffusely both legs  Extremity Pulses:  2+ dorsalis pedis, posterior tibial pulses bilaterally Musculoskeletal: No deformity bilateral trace ankle edema  Neurologic: Upper and lower extremity motor 5/5 and symmetric  DATA:  I did a SonoSite exam of the patient's greater saphenous vein in both legs at the bedside.  Right leg vein diameter was about 3 mm.  Left leg diameter was 4 to 4-1/2 mm.  It was more dilated from the mid thigh up to the saphenofemoral junction.  ASSESSMENT: Left foot pain with trace ankle edema bilaterally.  She does have some dilation of the left greater saphenous vein.   PLAN: We will do a trial of compression stockings to see if this improves her left foot pain.  She will follow-up in 3 months time for a formal venous duplex exam for consideration of laser ablation if her vein is significantly dilated with reflux and  the symptoms are improved a little bit with compression stockings which would suggest that her problem is vein related rather than orthopedic related.   , MD [facility] Office: [phone] Pager: [phone]

## 2019-09-22 ENCOUNTER — Other Ambulatory Visit: Payer: Self-pay | Admitting: *Deleted

## 2019-09-22 DIAGNOSIS — I83813 Varicose veins of bilateral lower extremities with pain: Secondary | ICD-10-CM

## 2019-10-15 DIAGNOSIS — I10 Essential (primary) hypertension: Secondary | ICD-10-CM | POA: Diagnosis not present

## 2019-10-15 DIAGNOSIS — F322 Major depressive disorder, single episode, severe without psychotic features: Secondary | ICD-10-CM | POA: Diagnosis not present

## 2019-10-15 DIAGNOSIS — M810 Age-related osteoporosis without current pathological fracture: Secondary | ICD-10-CM | POA: Diagnosis not present

## 2019-10-15 DIAGNOSIS — E782 Mixed hyperlipidemia: Secondary | ICD-10-CM | POA: Diagnosis not present

## 2019-10-25 DIAGNOSIS — Z0120 Encounter for dental examination and cleaning without abnormal findings: Secondary | ICD-10-CM | POA: Diagnosis not present

## 2019-10-26 DIAGNOSIS — Z0120 Encounter for dental examination and cleaning without abnormal findings: Secondary | ICD-10-CM | POA: Diagnosis not present

## 2019-11-10 DIAGNOSIS — F322 Major depressive disorder, single episode, severe without psychotic features: Secondary | ICD-10-CM | POA: Diagnosis not present

## 2019-11-10 DIAGNOSIS — E782 Mixed hyperlipidemia: Secondary | ICD-10-CM | POA: Diagnosis not present

## 2019-11-10 DIAGNOSIS — I10 Essential (primary) hypertension: Secondary | ICD-10-CM | POA: Diagnosis not present

## 2019-11-10 DIAGNOSIS — M810 Age-related osteoporosis without current pathological fracture: Secondary | ICD-10-CM | POA: Diagnosis not present

## 2019-11-21 ENCOUNTER — Other Ambulatory Visit: Payer: Medicare Other

## 2019-12-11 ENCOUNTER — Ambulatory Visit
Admission: RE | Admit: 2019-12-11 | Discharge: 2019-12-11 | Disposition: A | Discharge status: Home / Self Care | Payer: Medicare Other | Source: Ambulatory Visit | Attending: Internal Medicine | Admitting: Internal Medicine

## 2019-12-11 ENCOUNTER — Other Ambulatory Visit: Payer: Self-pay

## 2019-12-11 DIAGNOSIS — Z1231 Encounter for screening mammogram for malignant neoplasm of breast: Secondary | ICD-10-CM

## 2019-12-11 DIAGNOSIS — M810 Age-related osteoporosis without current pathological fracture: Secondary | ICD-10-CM

## 2019-12-11 DIAGNOSIS — Z780 Asymptomatic menopausal state: Secondary | ICD-10-CM | POA: Diagnosis not present

## 2019-12-19 DIAGNOSIS — F322 Major depressive disorder, single episode, severe without psychotic features: Secondary | ICD-10-CM | POA: Diagnosis not present

## 2019-12-19 DIAGNOSIS — E782 Mixed hyperlipidemia: Secondary | ICD-10-CM | POA: Diagnosis not present

## 2019-12-19 DIAGNOSIS — M810 Age-related osteoporosis without current pathological fracture: Secondary | ICD-10-CM | POA: Diagnosis not present

## 2019-12-19 DIAGNOSIS — I10 Essential (primary) hypertension: Secondary | ICD-10-CM | POA: Diagnosis not present

## 2020-01-17 ENCOUNTER — Other Ambulatory Visit: Payer: Self-pay

## 2020-01-17 ENCOUNTER — Ambulatory Visit (INDEPENDENT_AMBULATORY_CARE_PROVIDER_SITE_OTHER): Payer: Medicare Other | Admitting: Vascular Surgery

## 2020-01-17 ENCOUNTER — Ambulatory Visit (HOSPITAL_COMMUNITY)
Admission: RE | Admit: 2020-01-17 | Discharge: 2020-01-17 | Disposition: A | Discharge status: Home / Self Care | Payer: Medicare Other | Source: Ambulatory Visit | Attending: Vascular Surgery | Admitting: Vascular Surgery

## 2020-01-17 ENCOUNTER — Encounter: Payer: Self-pay | Admitting: Vascular Surgery

## 2020-01-17 VITALS — BP 110/64 | HR 81 | Temp 97.7°F | Resp 14 | Ht 63.5 in | Wt 127.0 lb

## 2020-01-17 DIAGNOSIS — I83812 Varicose veins of left lower extremities with pain: Secondary | ICD-10-CM

## 2020-01-17 DIAGNOSIS — I83813 Varicose veins of bilateral lower extremities with pain: Secondary | ICD-10-CM

## 2020-01-17 NOTE — Progress Notes (Signed)
Patient is a 74 year old female who returns for follow-up today.  She was recently evaluated for varicose veins about 3 months ago.  She has been wearing her compression stockings but is does not really think they helped much.  Her pain is primarily in the left foot.  The pain usually occurs while she is walking.  Sometimes it will occur if she pulls the foot back and stretches her Achilles tendon.  She does not really have much pain in her leg.  She does occasionally have some ankle swelling.  Physical exam:  Vitals:   01/17/20 1336  BP: 110/64  Pulse: 81  Resp: 14  Temp: 97.7 F (36.5 C)  TempSrc: Temporal  SpO2: 98%  Weight: 127 lb (57.6 kg)  Height: 5' 3.5" (1.613 m)   Extremities: 2+ dorsalis pedis posterior tibial pulse no edema  Skin: No rash scattered varicosities diffusely both legs  Data: Patient had a venous duplex today which showed that her greater saphenous vein was slightly dilated at 5 mm.  However there was no significant reflux or incompetence of her valves throughout the entire vein.  She did have some mild common femoral vein reflux.  Assessment: Patient with dilated left greater saphenous vein but no evidence of reflux.  Her primary complaint is foot pain.  This may be more arthritic in nature rather than related to her veins.  She has normal arterial circulation.  Plan: The patient will follow up with [place] on an as-needed basis.  We will try to get her an appointment scheduled with Dr. Thurston Hole who has seen her for orthopedic issues in the past.  Fabienne Bruns, MD [facility] Office: [phone]

## 2020-01-24 DIAGNOSIS — N813 Complete uterovaginal prolapse: Secondary | ICD-10-CM | POA: Diagnosis not present

## 2020-01-24 DIAGNOSIS — N814 Uterovaginal prolapse, unspecified: Secondary | ICD-10-CM | POA: Diagnosis not present

## 2020-01-24 DIAGNOSIS — R82998 Other abnormal findings in urine: Secondary | ICD-10-CM | POA: Diagnosis not present

## 2020-01-24 DIAGNOSIS — R54 Age-related physical debility: Secondary | ICD-10-CM | POA: Diagnosis not present

## 2020-01-24 DIAGNOSIS — N3946 Mixed incontinence: Secondary | ICD-10-CM | POA: Diagnosis not present

## 2020-01-24 DIAGNOSIS — N958 Other specified menopausal and perimenopausal disorders: Secondary | ICD-10-CM | POA: Diagnosis not present

## 2020-01-31 DIAGNOSIS — Z23 Encounter for immunization: Secondary | ICD-10-CM | POA: Diagnosis not present

## 2020-03-05 DIAGNOSIS — H04123 Dry eye syndrome of bilateral lacrimal glands: Secondary | ICD-10-CM | POA: Diagnosis not present

## 2020-03-05 DIAGNOSIS — H0102A Squamous blepharitis right eye, upper and lower eyelids: Secondary | ICD-10-CM | POA: Diagnosis not present

## 2020-03-05 DIAGNOSIS — Z961 Presence of intraocular lens: Secondary | ICD-10-CM | POA: Diagnosis not present

## 2020-03-05 DIAGNOSIS — H0102B Squamous blepharitis left eye, upper and lower eyelids: Secondary | ICD-10-CM | POA: Diagnosis not present

## 2020-03-11 DIAGNOSIS — E782 Mixed hyperlipidemia: Secondary | ICD-10-CM | POA: Diagnosis not present

## 2020-03-11 DIAGNOSIS — M810 Age-related osteoporosis without current pathological fracture: Secondary | ICD-10-CM | POA: Diagnosis not present

## 2020-03-11 DIAGNOSIS — G629 Polyneuropathy, unspecified: Secondary | ICD-10-CM | POA: Diagnosis not present

## 2020-03-11 DIAGNOSIS — Z0000 Encounter for general adult medical examination without abnormal findings: Secondary | ICD-10-CM | POA: Diagnosis not present

## 2020-03-11 DIAGNOSIS — Z1389 Encounter for screening for other disorder: Secondary | ICD-10-CM | POA: Diagnosis not present

## 2020-03-11 DIAGNOSIS — I10 Essential (primary) hypertension: Secondary | ICD-10-CM | POA: Diagnosis not present

## 2020-03-12 DIAGNOSIS — M810 Age-related osteoporosis without current pathological fracture: Secondary | ICD-10-CM | POA: Diagnosis not present

## 2020-03-12 DIAGNOSIS — E782 Mixed hyperlipidemia: Secondary | ICD-10-CM | POA: Diagnosis not present

## 2020-03-12 DIAGNOSIS — F322 Major depressive disorder, single episode, severe without psychotic features: Secondary | ICD-10-CM | POA: Diagnosis not present

## 2020-03-12 DIAGNOSIS — I10 Essential (primary) hypertension: Secondary | ICD-10-CM | POA: Diagnosis not present

## 2020-04-03 DIAGNOSIS — E782 Mixed hyperlipidemia: Secondary | ICD-10-CM | POA: Diagnosis not present

## 2020-04-03 DIAGNOSIS — M810 Age-related osteoporosis without current pathological fracture: Secondary | ICD-10-CM | POA: Diagnosis not present

## 2020-04-03 DIAGNOSIS — I10 Essential (primary) hypertension: Secondary | ICD-10-CM | POA: Diagnosis not present

## 2020-04-03 DIAGNOSIS — F322 Major depressive disorder, single episode, severe without psychotic features: Secondary | ICD-10-CM | POA: Diagnosis not present

## 2020-04-15 DIAGNOSIS — H0102A Squamous blepharitis right eye, upper and lower eyelids: Secondary | ICD-10-CM | POA: Diagnosis not present

## 2020-04-15 DIAGNOSIS — H0102B Squamous blepharitis left eye, upper and lower eyelids: Secondary | ICD-10-CM | POA: Diagnosis not present

## 2020-04-15 DIAGNOSIS — H40013 Open angle with borderline findings, low risk, bilateral: Secondary | ICD-10-CM | POA: Diagnosis not present

## 2020-04-15 DIAGNOSIS — E119 Type 2 diabetes mellitus without complications: Secondary | ICD-10-CM | POA: Diagnosis not present

## 2020-05-06 DIAGNOSIS — F322 Major depressive disorder, single episode, severe without psychotic features: Secondary | ICD-10-CM | POA: Diagnosis not present

## 2020-05-06 DIAGNOSIS — I10 Essential (primary) hypertension: Secondary | ICD-10-CM | POA: Diagnosis not present

## 2020-05-06 DIAGNOSIS — E782 Mixed hyperlipidemia: Secondary | ICD-10-CM | POA: Diagnosis not present

## 2020-05-06 DIAGNOSIS — M810 Age-related osteoporosis without current pathological fracture: Secondary | ICD-10-CM | POA: Diagnosis not present

## 2020-07-09 DIAGNOSIS — M810 Age-related osteoporosis without current pathological fracture: Secondary | ICD-10-CM | POA: Diagnosis not present

## 2020-07-09 DIAGNOSIS — I10 Essential (primary) hypertension: Secondary | ICD-10-CM | POA: Diagnosis not present

## 2020-07-09 DIAGNOSIS — K219 Gastro-esophageal reflux disease without esophagitis: Secondary | ICD-10-CM | POA: Diagnosis not present

## 2020-07-09 DIAGNOSIS — E782 Mixed hyperlipidemia: Secondary | ICD-10-CM | POA: Diagnosis not present

## 2020-07-23 DIAGNOSIS — L900 Lichen sclerosus et atrophicus: Secondary | ICD-10-CM | POA: Diagnosis not present

## 2020-07-23 DIAGNOSIS — N8111 Cystocele, midline: Secondary | ICD-10-CM | POA: Diagnosis not present

## 2020-07-23 DIAGNOSIS — N814 Uterovaginal prolapse, unspecified: Secondary | ICD-10-CM | POA: Diagnosis not present

## 2020-08-16 DIAGNOSIS — M810 Age-related osteoporosis without current pathological fracture: Secondary | ICD-10-CM | POA: Diagnosis not present

## 2020-08-16 DIAGNOSIS — F322 Major depressive disorder, single episode, severe without psychotic features: Secondary | ICD-10-CM | POA: Diagnosis not present

## 2020-08-16 DIAGNOSIS — I10 Essential (primary) hypertension: Secondary | ICD-10-CM | POA: Diagnosis not present

## 2020-08-16 DIAGNOSIS — E782 Mixed hyperlipidemia: Secondary | ICD-10-CM | POA: Diagnosis not present

## 2020-09-11 DIAGNOSIS — M810 Age-related osteoporosis without current pathological fracture: Secondary | ICD-10-CM | POA: Diagnosis not present

## 2020-09-11 DIAGNOSIS — E782 Mixed hyperlipidemia: Secondary | ICD-10-CM | POA: Diagnosis not present

## 2020-09-11 DIAGNOSIS — I10 Essential (primary) hypertension: Secondary | ICD-10-CM | POA: Diagnosis not present

## 2020-09-11 DIAGNOSIS — F330 Major depressive disorder, recurrent, mild: Secondary | ICD-10-CM | POA: Diagnosis not present

## 2020-09-23 DIAGNOSIS — M170 Bilateral primary osteoarthritis of knee: Secondary | ICD-10-CM | POA: Diagnosis not present

## 2020-09-30 DIAGNOSIS — M170 Bilateral primary osteoarthritis of knee: Secondary | ICD-10-CM | POA: Diagnosis not present

## 2020-10-07 DIAGNOSIS — R5383 Other fatigue: Secondary | ICD-10-CM | POA: Diagnosis not present

## 2020-10-07 DIAGNOSIS — M810 Age-related osteoporosis without current pathological fracture: Secondary | ICD-10-CM | POA: Diagnosis not present

## 2020-10-07 DIAGNOSIS — E559 Vitamin D deficiency, unspecified: Secondary | ICD-10-CM | POA: Diagnosis not present

## 2020-10-07 DIAGNOSIS — M170 Bilateral primary osteoarthritis of knee: Secondary | ICD-10-CM | POA: Diagnosis not present

## 2020-10-17 DIAGNOSIS — F322 Major depressive disorder, single episode, severe without psychotic features: Secondary | ICD-10-CM | POA: Diagnosis not present

## 2020-10-17 DIAGNOSIS — E782 Mixed hyperlipidemia: Secondary | ICD-10-CM | POA: Diagnosis not present

## 2020-10-17 DIAGNOSIS — M810 Age-related osteoporosis without current pathological fracture: Secondary | ICD-10-CM | POA: Diagnosis not present

## 2020-10-17 DIAGNOSIS — I10 Essential (primary) hypertension: Secondary | ICD-10-CM | POA: Diagnosis not present

## 2020-11-05 ENCOUNTER — Other Ambulatory Visit: Payer: Self-pay | Admitting: Internal Medicine

## 2020-11-05 DIAGNOSIS — Z1231 Encounter for screening mammogram for malignant neoplasm of breast: Secondary | ICD-10-CM

## 2020-11-07 DIAGNOSIS — M810 Age-related osteoporosis without current pathological fracture: Secondary | ICD-10-CM | POA: Diagnosis not present

## 2020-11-20 DIAGNOSIS — E782 Mixed hyperlipidemia: Secondary | ICD-10-CM | POA: Diagnosis not present

## 2020-11-20 DIAGNOSIS — I10 Essential (primary) hypertension: Secondary | ICD-10-CM | POA: Diagnosis not present

## 2020-11-20 DIAGNOSIS — M810 Age-related osteoporosis without current pathological fracture: Secondary | ICD-10-CM | POA: Diagnosis not present

## 2020-11-20 DIAGNOSIS — F322 Major depressive disorder, single episode, severe without psychotic features: Secondary | ICD-10-CM | POA: Diagnosis not present

## 2020-11-26 DIAGNOSIS — L218 Other seborrheic dermatitis: Secondary | ICD-10-CM | POA: Diagnosis not present

## 2020-11-26 DIAGNOSIS — L650 Telogen effluvium: Secondary | ICD-10-CM | POA: Diagnosis not present

## 2020-12-04 DIAGNOSIS — M170 Bilateral primary osteoarthritis of knee: Secondary | ICD-10-CM | POA: Diagnosis not present

## 2020-12-04 DIAGNOSIS — M810 Age-related osteoporosis without current pathological fracture: Secondary | ICD-10-CM | POA: Diagnosis not present

## 2020-12-27 ENCOUNTER — Ambulatory Visit
Admission: RE | Admit: 2020-12-27 | Discharge: 2020-12-27 | Disposition: A | Discharge status: Home / Self Care | Payer: Medicare Other | Source: Ambulatory Visit | Attending: Internal Medicine | Admitting: Internal Medicine

## 2020-12-27 ENCOUNTER — Other Ambulatory Visit: Payer: Self-pay

## 2020-12-27 DIAGNOSIS — Z1231 Encounter for screening mammogram for malignant neoplasm of breast: Secondary | ICD-10-CM | POA: Diagnosis not present

## 2021-01-16 DIAGNOSIS — Z23 Encounter for immunization: Secondary | ICD-10-CM | POA: Diagnosis not present

## 2021-02-04 DIAGNOSIS — M810 Age-related osteoporosis without current pathological fracture: Secondary | ICD-10-CM | POA: Diagnosis not present

## 2021-02-04 DIAGNOSIS — I10 Essential (primary) hypertension: Secondary | ICD-10-CM | POA: Diagnosis not present

## 2021-02-04 DIAGNOSIS — E782 Mixed hyperlipidemia: Secondary | ICD-10-CM | POA: Diagnosis not present

## 2021-02-04 DIAGNOSIS — M169 Osteoarthritis of hip, unspecified: Secondary | ICD-10-CM | POA: Diagnosis not present

## 2021-02-27 ENCOUNTER — Other Ambulatory Visit: Payer: Self-pay | Admitting: Physician Assistant

## 2021-02-27 DIAGNOSIS — L659 Nonscarring hair loss, unspecified: Secondary | ICD-10-CM | POA: Diagnosis not present

## 2021-02-27 DIAGNOSIS — L298 Other pruritus: Secondary | ICD-10-CM | POA: Diagnosis not present

## 2021-03-11 DIAGNOSIS — L638 Other alopecia areata: Secondary | ICD-10-CM | POA: Diagnosis not present

## 2021-03-27 DIAGNOSIS — E782 Mixed hyperlipidemia: Secondary | ICD-10-CM | POA: Diagnosis not present

## 2021-03-27 DIAGNOSIS — Z1389 Encounter for screening for other disorder: Secondary | ICD-10-CM | POA: Diagnosis not present

## 2021-03-27 DIAGNOSIS — G629 Polyneuropathy, unspecified: Secondary | ICD-10-CM | POA: Diagnosis not present

## 2021-03-27 DIAGNOSIS — E559 Vitamin D deficiency, unspecified: Secondary | ICD-10-CM | POA: Diagnosis not present

## 2021-03-27 DIAGNOSIS — Z0000 Encounter for general adult medical examination without abnormal findings: Secondary | ICD-10-CM | POA: Diagnosis not present

## 2021-03-27 DIAGNOSIS — I10 Essential (primary) hypertension: Secondary | ICD-10-CM | POA: Diagnosis not present

## 2021-03-27 DIAGNOSIS — M810 Age-related osteoporosis without current pathological fracture: Secondary | ICD-10-CM | POA: Diagnosis not present

## 2021-03-27 DIAGNOSIS — R7303 Prediabetes: Secondary | ICD-10-CM | POA: Diagnosis not present

## 2021-04-01 ENCOUNTER — Other Ambulatory Visit (HOSPITAL_BASED_OUTPATIENT_CLINIC_OR_DEPARTMENT_OTHER): Payer: Self-pay

## 2021-04-01 ENCOUNTER — Ambulatory Visit: Payer: Medicare Other | Attending: Internal Medicine

## 2021-04-01 DIAGNOSIS — Z23 Encounter for immunization: Secondary | ICD-10-CM

## 2021-04-01 MED ORDER — PFIZER COVID-19 VAC BIVALENT 30 MCG/0.3ML IM SUSP
INTRAMUSCULAR | 0 refills | Status: DC
  Filled 2021-04-01: qty 0.3, 1d supply, fill #0

## 2021-04-01 NOTE — Progress Notes (Signed)
° °  Covid-19 Vaccination Clinic  Name:  REGANA KEMPLE    MRN: [id number] DOB: 08-13-1945  04/01/2021  Ms. Faughnan was observed post Covid-19 immunization for 15 minutes without incident. She was provided with Vaccine Information Sheet and instruction to access the V-Safe system.   Ms. Armacost was instructed to call [phone] with any severe reactions post vaccine: Difficulty breathing  Swelling of face and throat  A fast heartbeat  A bad rash all over body  Dizziness and weakness   Immunizations Administered     Name Date Dose VIS Date Route   Pfizer Covid-19 Vaccine Bivalent Booster 04/01/2021  2:56 PM 0.3 mL 12/18/2020 Intramuscular   Manufacturer: [employer], [employer]   Lot: [id number]   NDC: [phone]

## 2021-04-16 DIAGNOSIS — H40013 Open angle with borderline findings, low risk, bilateral: Secondary | ICD-10-CM | POA: Diagnosis not present

## 2021-04-16 DIAGNOSIS — H04123 Dry eye syndrome of bilateral lacrimal glands: Secondary | ICD-10-CM | POA: Diagnosis not present

## 2021-04-16 DIAGNOSIS — H0102A Squamous blepharitis right eye, upper and lower eyelids: Secondary | ICD-10-CM | POA: Diagnosis not present

## 2021-04-16 DIAGNOSIS — H0102B Squamous blepharitis left eye, upper and lower eyelids: Secondary | ICD-10-CM | POA: Diagnosis not present

## 2021-05-01 IMAGING — MG DIGITAL SCREENING BILATERAL MAMMOGRAM WITH TOMO AND CAD
8 series · 8 of 24 positions shown · non-contrast
Comparison: Previous exam(s).

CLINICAL DATA: Screening.

EXAM:
DIGITAL SCREENING BILATERAL MAMMOGRAM WITH TOMO AND CAD

[L MLO synth-2D]
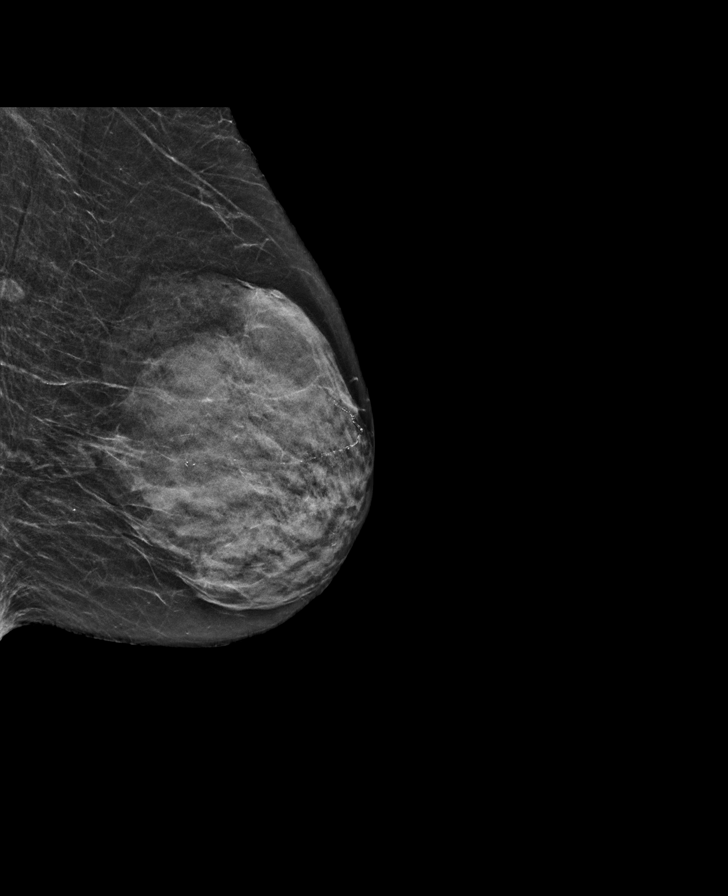

[L CC synth-2D]
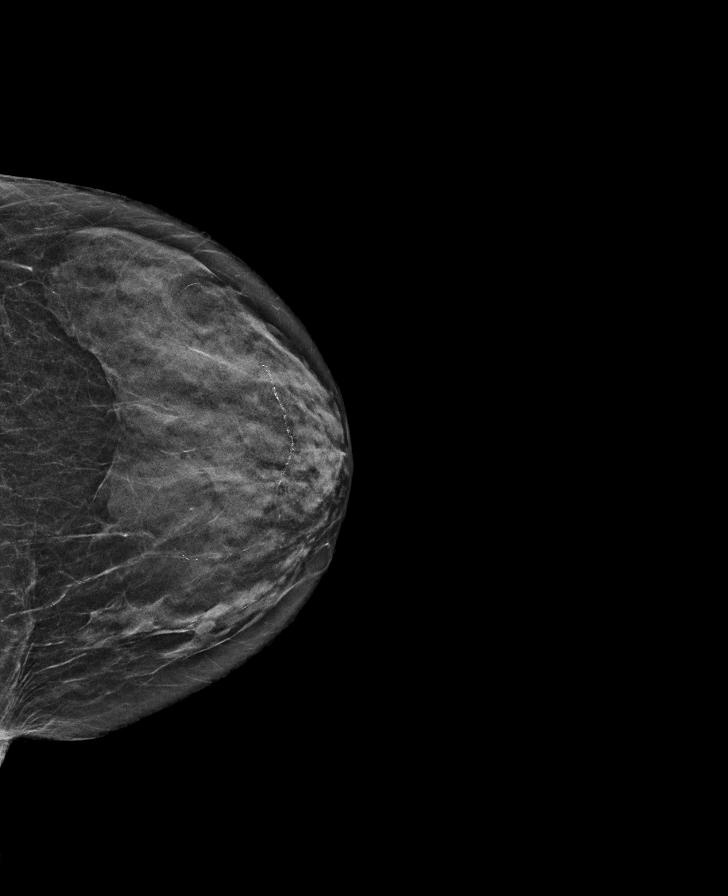

[R MLO synth-2D]
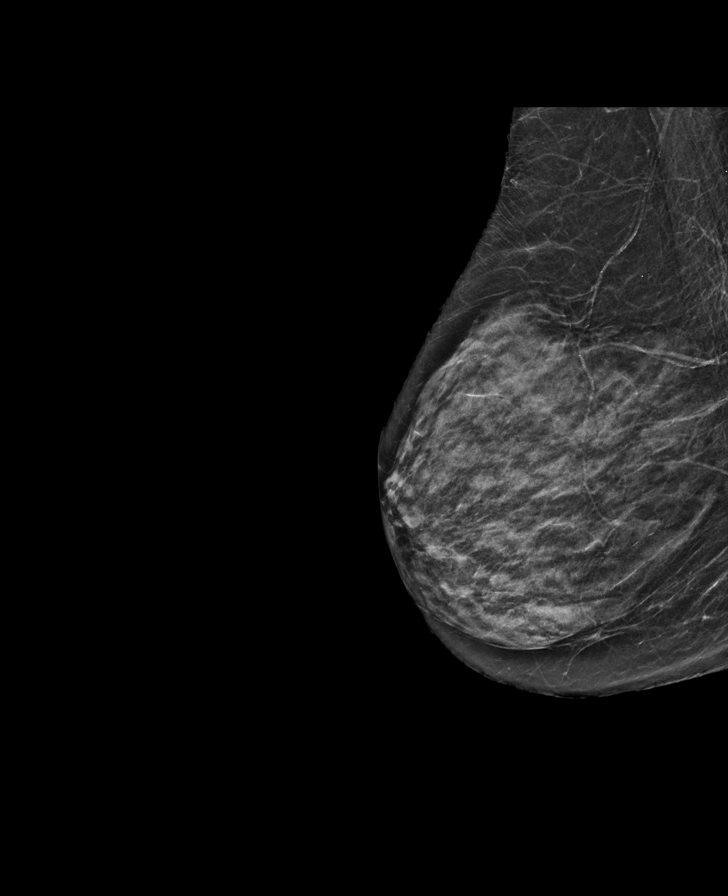

[R CC synth-2D]
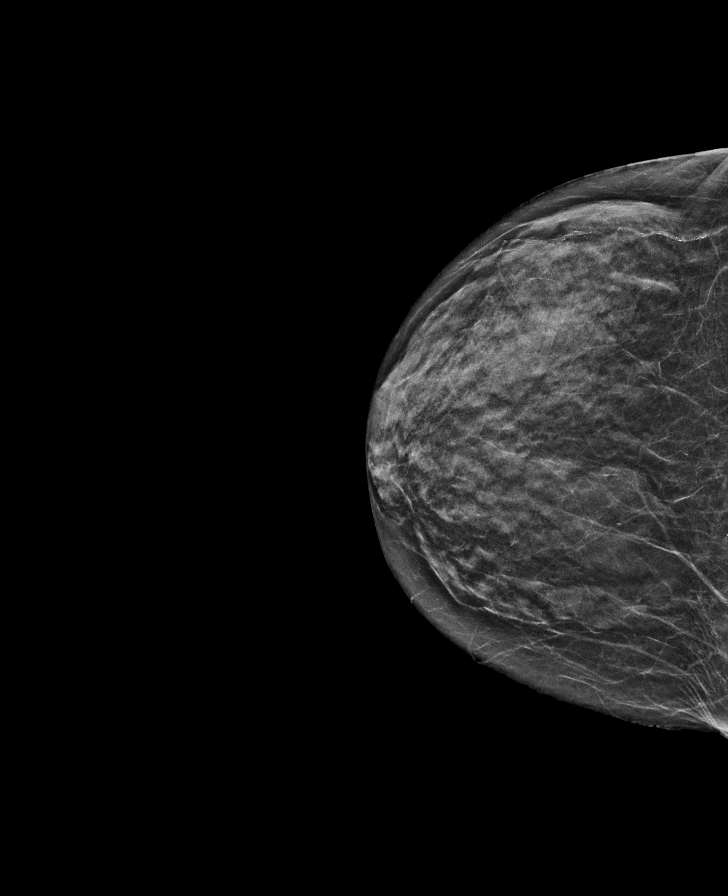

[L CC tomo · tomo slice 23/44.0]
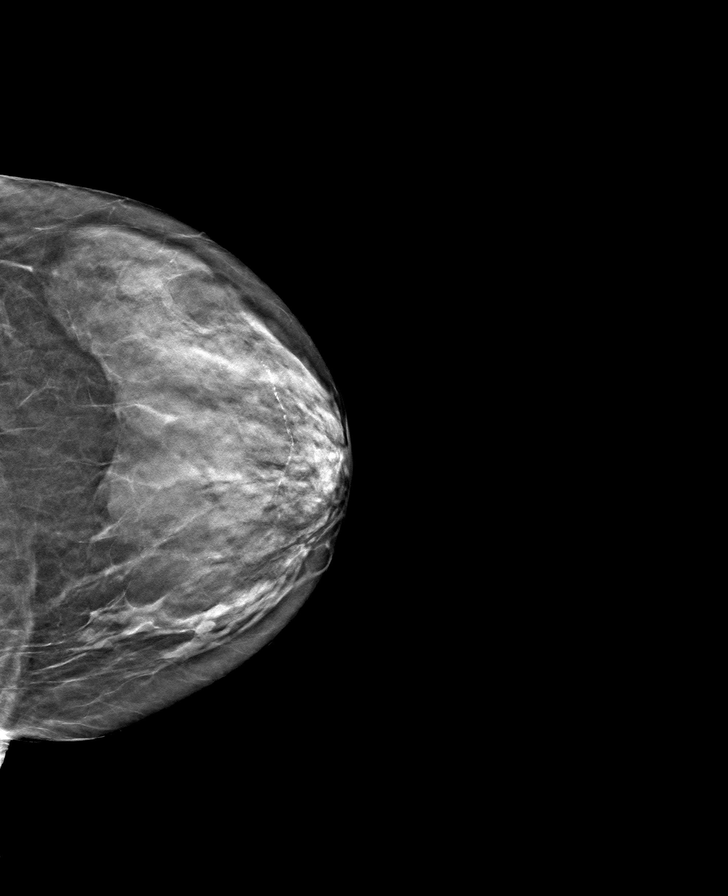

[R CC tomo · tomo slice 21/42.0]
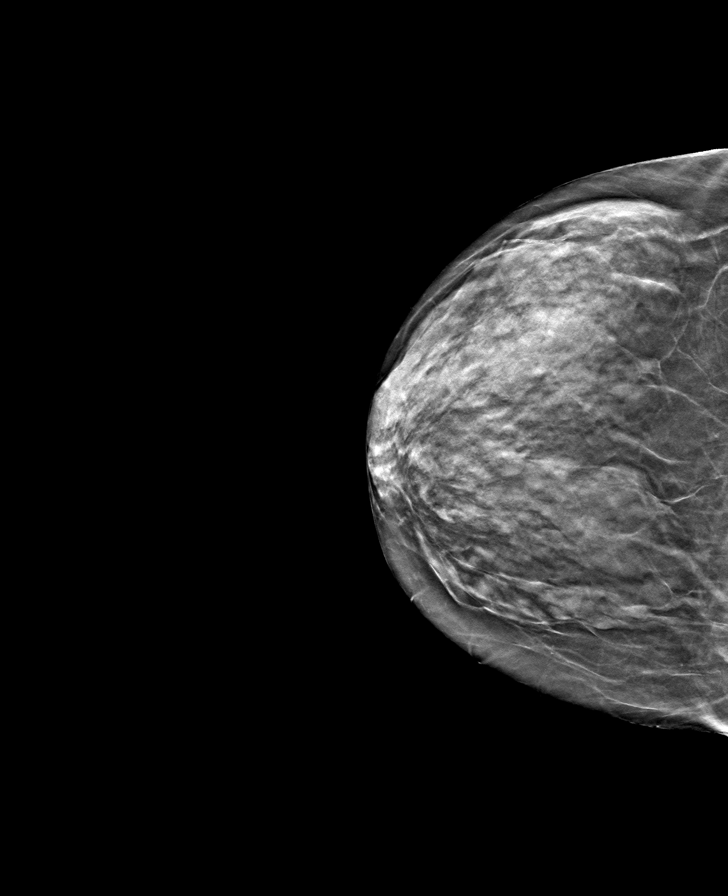

[R MLO tomo · tomo slice 23/46.0]
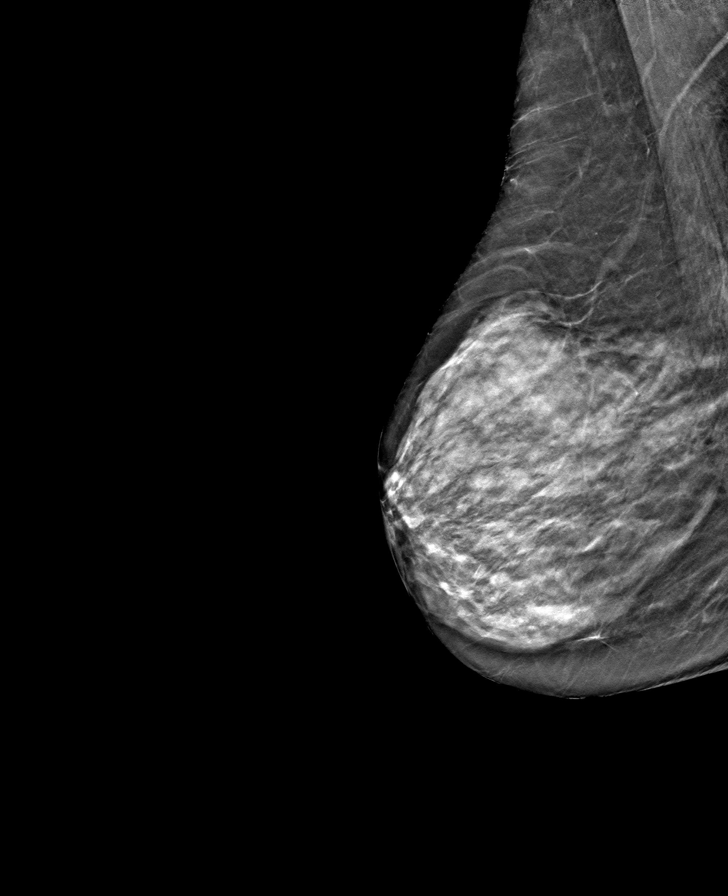

[L MLO tomo · tomo slice 23/46.0]
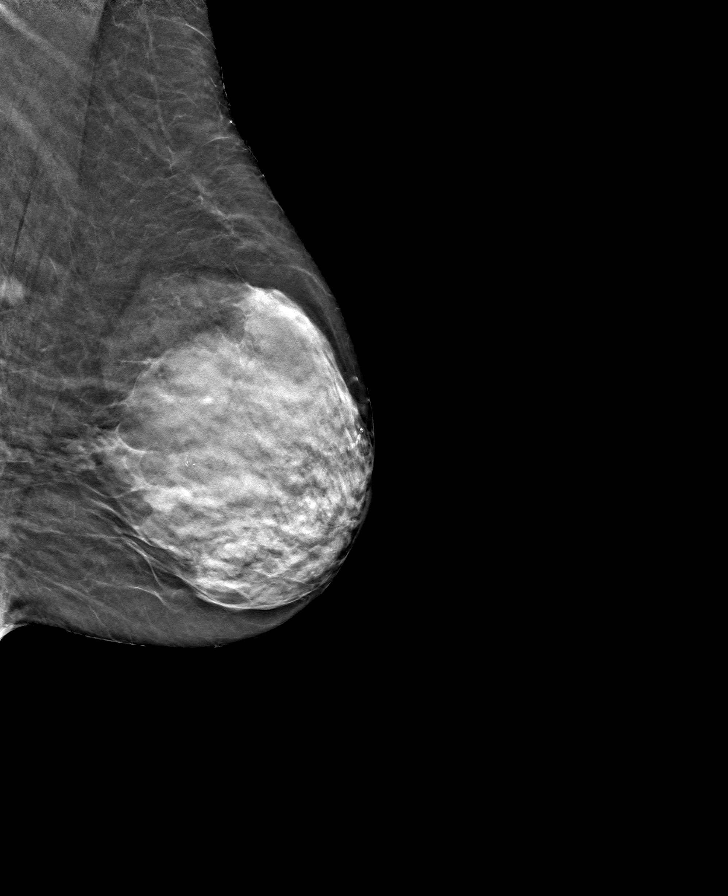

[8 of 24 positions shown; findings below may reference images not displayed]

ACR Breast Density Category d: The breast tissue is extremely dense,
which lowers the sensitivity of mammography
FINDINGS: There are no findings suspicious for malignancy. Images were
processed with CAD.
IMPRESSION: No mammographic evidence of malignancy. A result letter of this
screening mammogram will be mailed directly to the patient.

RECOMMENDATION:
Screening mammogram in one year. (Code:[id number]-[id number])

BI-RADS CATEGORY  1: Negative.

## 2021-06-11 DIAGNOSIS — M810 Age-related osteoporosis without current pathological fracture: Secondary | ICD-10-CM | POA: Diagnosis not present

## 2021-06-11 DIAGNOSIS — M170 Bilateral primary osteoarthritis of knee: Secondary | ICD-10-CM | POA: Diagnosis not present

## 2021-06-30 DIAGNOSIS — B370 Candidal stomatitis: Secondary | ICD-10-CM | POA: Diagnosis not present

## 2021-06-30 DIAGNOSIS — J029 Acute pharyngitis, unspecified: Secondary | ICD-10-CM | POA: Diagnosis not present

## 2021-07-10 DIAGNOSIS — L638 Other alopecia areata: Secondary | ICD-10-CM | POA: Diagnosis not present

## 2021-07-10 DIAGNOSIS — L218 Other seborrheic dermatitis: Secondary | ICD-10-CM | POA: Diagnosis not present

## 2021-07-10 DIAGNOSIS — L648 Other androgenic alopecia: Secondary | ICD-10-CM | POA: Diagnosis not present

## 2021-07-11 DIAGNOSIS — J309 Allergic rhinitis, unspecified: Secondary | ICD-10-CM | POA: Diagnosis not present

## 2021-07-11 DIAGNOSIS — R1314 Dysphagia, pharyngoesophageal phase: Secondary | ICD-10-CM | POA: Diagnosis not present

## 2021-08-05 DIAGNOSIS — Z01419 Encounter for gynecological examination (general) (routine) without abnormal findings: Secondary | ICD-10-CM | POA: Diagnosis not present

## 2021-08-05 DIAGNOSIS — N812 Incomplete uterovaginal prolapse: Secondary | ICD-10-CM | POA: Diagnosis not present

## 2021-08-05 DIAGNOSIS — L900 Lichen sclerosus et atrophicus: Secondary | ICD-10-CM | POA: Diagnosis not present

## 2021-08-05 DIAGNOSIS — M810 Age-related osteoporosis without current pathological fracture: Secondary | ICD-10-CM | POA: Diagnosis not present

## 2021-08-11 ENCOUNTER — Other Ambulatory Visit: Payer: Self-pay | Admitting: Internal Medicine

## 2021-08-11 DIAGNOSIS — Z1231 Encounter for screening mammogram for malignant neoplasm of breast: Secondary | ICD-10-CM

## 2021-09-23 DIAGNOSIS — F330 Major depressive disorder, recurrent, mild: Secondary | ICD-10-CM | POA: Diagnosis not present

## 2021-09-23 DIAGNOSIS — R7303 Prediabetes: Secondary | ICD-10-CM | POA: Diagnosis not present

## 2021-09-23 DIAGNOSIS — I10 Essential (primary) hypertension: Secondary | ICD-10-CM | POA: Diagnosis not present

## 2021-09-23 DIAGNOSIS — K219 Gastro-esophageal reflux disease without esophagitis: Secondary | ICD-10-CM | POA: Diagnosis not present

## 2021-10-10 DIAGNOSIS — F322 Major depressive disorder, single episode, severe without psychotic features: Secondary | ICD-10-CM | POA: Diagnosis not present

## 2021-10-10 DIAGNOSIS — I10 Essential (primary) hypertension: Secondary | ICD-10-CM | POA: Diagnosis not present

## 2021-10-10 DIAGNOSIS — E782 Mixed hyperlipidemia: Secondary | ICD-10-CM | POA: Diagnosis not present

## 2021-10-10 DIAGNOSIS — M810 Age-related osteoporosis without current pathological fracture: Secondary | ICD-10-CM | POA: Diagnosis not present

## 2021-11-10 ENCOUNTER — Emergency Department (HOSPITAL_COMMUNITY)
Admission: EM | Admit: 2021-11-10 | Discharge: 2021-11-10 | Disposition: A | Discharge status: Home / Self Care | Payer: Medicare Other | Attending: Emergency Medicine | Admitting: Emergency Medicine

## 2021-11-10 ENCOUNTER — Emergency Department (HOSPITAL_COMMUNITY): Payer: Medicare Other

## 2021-11-10 ENCOUNTER — Other Ambulatory Visit: Payer: Self-pay

## 2021-11-10 DIAGNOSIS — S299XXA Unspecified injury of thorax, initial encounter: Secondary | ICD-10-CM | POA: Diagnosis not present

## 2021-11-10 DIAGNOSIS — J449 Chronic obstructive pulmonary disease, unspecified: Secondary | ICD-10-CM | POA: Diagnosis not present

## 2021-11-10 DIAGNOSIS — S2242XA Multiple fractures of ribs, left side, initial encounter for closed fracture: Secondary | ICD-10-CM | POA: Diagnosis not present

## 2021-11-10 DIAGNOSIS — Z79899 Other long term (current) drug therapy: Secondary | ICD-10-CM | POA: Diagnosis not present

## 2021-11-10 DIAGNOSIS — I10 Essential (primary) hypertension: Secondary | ICD-10-CM | POA: Insufficient documentation

## 2021-11-10 DIAGNOSIS — M25512 Pain in left shoulder: Secondary | ICD-10-CM | POA: Diagnosis not present

## 2021-11-10 DIAGNOSIS — X509XXA Other and unspecified overexertion or strenuous movements or postures, initial encounter: Secondary | ICD-10-CM | POA: Insufficient documentation

## 2021-11-10 DIAGNOSIS — M549 Dorsalgia, unspecified: Secondary | ICD-10-CM | POA: Diagnosis not present

## 2021-11-10 DIAGNOSIS — M954 Acquired deformity of chest and rib: Secondary | ICD-10-CM | POA: Diagnosis not present

## 2021-11-10 MED ORDER — LIDOCAINE 5 % EX PTCH
1.0000 | MEDICATED_PATCH | CUTANEOUS | Status: DC
  Administered 2021-11-10: 1 via TRANSDERMAL
  Filled 2021-11-10: qty 1

## 2021-11-10 MED ORDER — OXYCODONE HCL 5 MG PO TABS: 5.0000 mg | ORAL_TABLET | ORAL | 0 refills | Status: DC | PRN

## 2021-11-10 MED ORDER — LIDOCAINE 5 % EX PTCH: 1.0000 | MEDICATED_PATCH | CUTANEOUS | 0 refills | Status: AC

## 2021-11-10 MED ORDER — ACETAMINOPHEN 325 MG PO TABS
650.0000 mg | ORAL_TABLET | Freq: Once | ORAL | Status: AC
  Administered 2021-11-10: 650 mg via ORAL
  Filled 2021-11-10: qty 2
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
## 2021-11-10 NOTE — ED Provider Notes (Signed)
[facility]-EMERGENCY DEPT Provider Note   CSN: [id number] Arrival date & time: 11/10/21  1701     History  Chief Complaint  Patient presents with   Back Pain    Mary Mcdowell is a 76 y.o. female.   Back Pain  76 year old female presents emergency department with complaints of left-sided back pain.  Patient states that symptoms began today when she was on the toilet.  She leaned forward and felt pain begin in her left upper back.  She states "feels like my bone popped out and popped back in in my spine."  She has history of similar symptoms occurring approximately 1 to 2 months ago.  Pain eventually subsided with Tylenol and using heating pad.  She states that since Saturday, pain has been persistent.  She has tried at home Tylenol with minimal to no relief.  She describes pain as radiating to left breast.  She denies chest pain, shortness of breath, abdominal pain, nausea/vomiting/diarrhea, urinary/vaginal symptoms, change in bowel habits, melena, hematochezia.  Denies fever, bowel/bladder dysfunction, weakness/sensory deficits in lower extremities, history of IV drug use.   Home Medications Prior to Admission medications   Medication Sig Start Date End Date Taking? Authorizing Provider  lidocaine (LIDODERM) 5 % Place 1 patch onto the skin daily. Remove & Discard patch within 12 hours or as directed by MD 11/10/21  Yes Peter Garter, PA  alendronate (FOSAMAX) 70 MG tablet Take 70 mg by mouth once a week. 08/28/19   [provider]  amLODipine (NORVASC) 5 MG tablet Take 5 mg by mouth daily. 08/28/19   [provider]  COVID-19 mRNA bivalent vaccine, Pfizer, (PFIZER COVID-19 VAC BIVALENT) injection Inject into the muscle. 04/01/21   Judyann Munson, MD  hydrocortisone 2.5 % cream APPLY TWICE DAILY AS NEEDED FOR HEMORRHOID BLEEDING 07/12/19   [provider]  meclizine (ANTIVERT) 25 MG tablet Take 25 mg by mouth 2 (two) times daily as needed  for dizziness.    [provider]  meloxicam (MOBIC) 7.5 MG tablet Take 7.5 mg by mouth daily. 09/11/19   [provider]  oxyCODONE (ROXICODONE) 5 MG immediate release tablet Take 1 tablet (5 mg total) by mouth every 4 (four) hours as needed for severe pain. 11/10/21   Sherian Maroon A, PA  pregabalin (LYRICA) 150 MG capsule TAKE ONE CAPSULE BY MOUTH AT BEDTIME. GENERIC EQUIVALENT FOR LYRICA 07/25/19   [provider]  psyllium (METAMUCIL) 58.6 % powder Take 1 packet by mouth daily.    [provider]  venlafaxine XR (EFFEXOR-XR) 75 MG 24 hr capsule TAKE 1 CAPSULE BY MOUTH DAILY WITH FOOD 06/20/19   [provider]  Vitamin D, Ergocalciferol, (DRISDOL) 1.25 MG (50000 UNIT) CAPS capsule Take 50,000 Units by mouth once a week. 07/27/19   [provider]      Allergies    Codeine and Nsaids    Review of Systems   Review of Systems  Musculoskeletal:  Positive for back pain.    Physical Exam Updated Vital Signs BP 135/64 (BP Location: Left Arm)   Pulse 100   Temp 98.1 F (36.7 C) (Oral)   Resp 18   SpO2 96%  Physical Exam Vitals and nursing note reviewed.  Constitutional:      General: She is not in acute distress.    Appearance: She is well-developed.  HENT:     Head: Normocephalic and atraumatic.  Eyes:     Conjunctiva/sclera: Conjunctivae normal.  Cardiovascular:  Rate and Rhythm: Normal rate and regular rhythm.  Pulmonary:     Effort: Pulmonary effort is normal. No respiratory distress.     Breath sounds: Normal breath sounds.  Abdominal:     Palpations: Abdomen is soft.     Tenderness: There is no abdominal tenderness.  Musculoskeletal:        General: No swelling.     Cervical back: Neck supple.       Back:     Comments: No tender to palpation along midline cervical, thoracic, lumbar spine.  Patient tender to palpation in the area depicted above left paraspinal thoracic area.  Patient also experiencing tenderness on  the left anterior aspect of chest wall.  No dermatomal type rash noted.  No obvious breaks in the skin.  Patient is hesitant to horizontal abduction of left shoulder secondary to increasing pain.  Skin:    General: Skin is warm and dry.     Capillary Refill: Capillary refill takes less than 2 seconds.  Neurological:     Mental Status: She is alert.  Psychiatric:        Mood and Affect: Mood normal.     ED Results / Procedures / Treatments   Labs (all labs ordered are listed, but only abnormal results are displayed) Labs Reviewed - No data to display  EKG None  Radiology DG Ribs Unilateral W/Chest Left  Result Date: 11/10/2021 CLINICAL DATA:  The patient injured her back leaning forward on the toilet 2 days ago with no improvement. Pain is in the left back, shoulder blade, left ribs. EXAM: LEFT RIBS AND CHEST - 3+ VIEW COMPARISON:  Portable chest 09/12/2004 FINDINGS: There are deformities in the anterior left fifth and sixth rib ends which could be due to old healed trauma or nondisplaced fractures. This was not seen in 2006. The bone mineralization is osteopenic with no other focal rib abnormality or acute displaced fracture being seen. There are degenerative changes of the shoulders and mild thoracic dextroscoliosis. There is no evidence of pneumothorax or pleural effusion. Both lungs are clear with COPD change. Heart size and mediastinal contours are within normal limits with mild aortic tortuosity and atherosclerosis. There is L1-3 fusion hardware which was not seen in 2006, and appears to consist of an L2 corpectomy with interbody metallic graft and a left lateral L1-3 sideplate fusion. IMPRESSION: 1. Deformities of the anterior left fifth and sixth rib ends which could be due to old healed trauma or nondisplaced recent fractures. 2. Osteopenia with no other acute chest findings.  COPD. 3. Thoracic dextroscoliosis and degenerative changes with L1-3 fusion hardware. 4. Aortic  atherosclerosis. Electronically Signed   By: Almira Bar M.D.   On: 11/10/2021 20:02    Procedures Procedures    Medications Ordered in ED Medications  lidocaine (LIDODERM) 5 % 1 patch (1 patch Transdermal Patch Applied 11/10/21 1955)  acetaminophen (TYLENOL) tablet 650 mg (650 mg Oral Given 11/10/21 1955)    ED Course/ Medical Decision Making/ A&P                           Medical Decision Making  This patient presents to the ED for concern of back pain, this involves an extensive number of treatment options, and is a complaint that carries with it a high risk of complications and morbidity.  The differential diagnosis includes The emergent differential diagnosis for back pain includes but is not limited to fracture, muscle strain, cauda equina,  spinal stenosis. DDD, ankylosing spondylitis, acute ligamentous injury, disk herniation, spondylolisthesis, Epidural compression syndrome, metastatic cancer, transverse myelitis, vertebral osteomyelitis, diskitis, kidney stone, pyelonephritis, AAA, Perforated ulcer, Retrocecal appendicitis, pancreatitis, bowel obstruction, retroperitoneal hemorrhage or mass, meningitis.   Co morbidities that complicate the patient evaluation  Hypertension, prediabetes, GERD, peptic stricture, hiatal hernia, diverticulitis, shingles, IBS   Additional history obtained:  Additional history obtained from EMR   Lab Tests:  N/a   Imaging Studies ordered:  I ordered imaging studies including chest x-ray I independently visualized and interpreted imaging which showed deformities of left anterior fifth and sixth ribs.  Osteopenia with no acute chest findings.  COPD.  Thoracic dextroscoliosis and degenerative changes with L1-L3 fusion hardware.  Aortic atherosclerosis I agree with the radiologist interpretation  Cardiac Monitoring: / EKG:  The patient was maintained on a cardiac monitor.  I personally viewed and interpreted the cardiac monitored which  showed an underlying rhythm of: Sinus rhythm   Consultations Obtained:  N/a   Problem List / ED Course / Critical interventions / Medication management  Back pain I ordered medication including Lidoderm and Tylenol for pain.   Reevaluation of the patient after these medicines showed that the patient improved I have reviewed the patients home medicines and have made adjustments as needed   Social Determinants of Health:  Denies IV drug use, alcohol, tobacco use.   Test / Admission - Considered:  Back pain Vitals signs within normal range and stable throughout visit. Laboratory/imaging studies significant for: See above Patient's symptoms most likely secondary to rib fracture as indicated above with secondary musculoskeletal pain in patient's posterior left paraspinal area.  Doubt intrathoracic or intra-abdominal pathology given lack of associated symptoms on HPI.  Doubt cauda equina.  Doubt transverse myelitis.  Doubt kidney stone.  Doubt perforated ulcer.  Doubt meningitis. Patient to be treated outpatient with oral pain medication, topical Lidoderm patches as well as spirometry.  Follow-up with PCP recommended in 3 to 5 days for reevaluation of patient's symptoms.  Patient has at home care through her daughter-in-law as well as daughter who watched around-the-clock for worsening signs and symptoms.  Patient wished not to be admitted to the hospital at this time.  She was deemed to have capacity.  Patient and daughter-in-law acknowledge understanding of treatment plan and were agreeable to said plan. Worrisome signs and symptoms were discussed with the patient, and the patient acknowledged understanding to return to the ED if noticed. Patient was stable upon discharge.         Final Clinical Impression(s) / ED Diagnoses Final diagnoses:  Closed fracture of multiple ribs of left side, initial encounter    Rx / DC Orders ED Discharge Orders          Ordered    oxyCODONE  (ROXICODONE) 5 MG immediate release tablet  Every 4 hours PRN,   Status:  Discontinued        11/10/21 2047    lidocaine (LIDODERM) 5 %  Every 24 hours        11/10/21 2047    oxyCODONE (ROXICODONE) 5 MG immediate release tablet  Every 4 hours PRN        11/10/21 2047              Peter Garter, [place] 11/10/21 2050    Alvira Monday, MD 11/11/21 1200

## 2021-11-13 DIAGNOSIS — I700 Atherosclerosis of aorta: Secondary | ICD-10-CM | POA: Diagnosis not present

## 2021-11-13 DIAGNOSIS — M810 Age-related osteoporosis without current pathological fracture: Secondary | ICD-10-CM | POA: Diagnosis not present

## 2021-11-13 DIAGNOSIS — J449 Chronic obstructive pulmonary disease, unspecified: Secondary | ICD-10-CM | POA: Diagnosis not present

## 2021-11-13 DIAGNOSIS — S2239XA Fracture of one rib, unspecified side, initial encounter for closed fracture: Secondary | ICD-10-CM | POA: Diagnosis not present

## 2021-12-02 DIAGNOSIS — M810 Age-related osteoporosis without current pathological fracture: Secondary | ICD-10-CM | POA: Diagnosis not present

## 2021-12-02 DIAGNOSIS — E559 Vitamin D deficiency, unspecified: Secondary | ICD-10-CM | POA: Diagnosis not present

## 2021-12-02 DIAGNOSIS — R5383 Other fatigue: Secondary | ICD-10-CM | POA: Diagnosis not present

## 2021-12-10 DIAGNOSIS — M1611 Unilateral primary osteoarthritis, right hip: Secondary | ICD-10-CM | POA: Diagnosis not present

## 2021-12-10 DIAGNOSIS — R0781 Pleurodynia: Secondary | ICD-10-CM | POA: Diagnosis not present

## 2021-12-10 DIAGNOSIS — M5136 Other intervertebral disc degeneration, lumbar region: Secondary | ICD-10-CM | POA: Diagnosis not present

## 2021-12-10 DIAGNOSIS — M810 Age-related osteoporosis without current pathological fracture: Secondary | ICD-10-CM | POA: Diagnosis not present

## 2021-12-30 ENCOUNTER — Ambulatory Visit
Admission: RE | Admit: 2021-12-30 | Discharge: 2021-12-30 | Disposition: A | Discharge status: Home / Self Care | Payer: Medicare Other | Source: Ambulatory Visit | Attending: Internal Medicine | Admitting: Internal Medicine

## 2021-12-30 DIAGNOSIS — Z1231 Encounter for screening mammogram for malignant neoplasm of breast: Secondary | ICD-10-CM

## 2022-02-25 DIAGNOSIS — M5010 Cervical disc disorder with radiculopathy, unspecified cervical region: Secondary | ICD-10-CM | POA: Diagnosis not present

## 2022-03-17 DIAGNOSIS — M5010 Cervical disc disorder with radiculopathy, unspecified cervical region: Secondary | ICD-10-CM | POA: Diagnosis not present

## 2022-03-31 ENCOUNTER — Other Ambulatory Visit: Payer: Self-pay | Admitting: Internal Medicine

## 2022-03-31 DIAGNOSIS — M169 Osteoarthritis of hip, unspecified: Secondary | ICD-10-CM | POA: Diagnosis not present

## 2022-03-31 DIAGNOSIS — F330 Major depressive disorder, recurrent, mild: Secondary | ICD-10-CM | POA: Diagnosis not present

## 2022-03-31 DIAGNOSIS — M810 Age-related osteoporosis without current pathological fracture: Secondary | ICD-10-CM

## 2022-03-31 DIAGNOSIS — E782 Mixed hyperlipidemia: Secondary | ICD-10-CM | POA: Diagnosis not present

## 2022-03-31 DIAGNOSIS — I10 Essential (primary) hypertension: Secondary | ICD-10-CM | POA: Diagnosis not present

## 2022-03-31 DIAGNOSIS — E559 Vitamin D deficiency, unspecified: Secondary | ICD-10-CM | POA: Diagnosis not present

## 2022-03-31 DIAGNOSIS — I700 Atherosclerosis of aorta: Secondary | ICD-10-CM | POA: Diagnosis not present

## 2022-03-31 DIAGNOSIS — R7303 Prediabetes: Secondary | ICD-10-CM | POA: Diagnosis not present

## 2022-03-31 DIAGNOSIS — Z0000 Encounter for general adult medical examination without abnormal findings: Secondary | ICD-10-CM | POA: Diagnosis not present

## 2022-04-06 DIAGNOSIS — M810 Age-related osteoporosis without current pathological fracture: Secondary | ICD-10-CM | POA: Diagnosis not present

## 2022-04-06 DIAGNOSIS — Z780 Asymptomatic menopausal state: Secondary | ICD-10-CM | POA: Diagnosis not present

## 2022-04-06 LAB — HM DEXA SCAN

## 2022-04-27 DIAGNOSIS — M1611 Unilateral primary osteoarthritis, right hip: Secondary | ICD-10-CM | POA: Diagnosis not present

## 2022-04-28 ENCOUNTER — Ambulatory Visit: Payer: Self-pay | Admitting: Physician Assistant

## 2022-04-28 DIAGNOSIS — G8929 Other chronic pain: Secondary | ICD-10-CM

## 2022-04-28 NOTE — Progress Notes (Signed)
Sent message, via epic in basket, requesting order in epic from surgeon    04/28/22 0912  Preop Orders  Has preop orders? No  Name of staff/physician contacted for orders(Indicate phone or IB message) Charlies Constable, MD.

## 2022-04-28 NOTE — H&P (View-Only) (Signed)
TOTAL HIP ADMISSION H&P  Patient is admitted for right total hip arthroplasty.  Subjective:  Chief Complaint: right hip pain  HPI: Mary Mcdowell, 77 y.o. female, has a history of pain and functional disability in the right hip(s) due to arthritis and patient has failed non-surgical conservative treatments for greater than 12 weeks to include NSAID's and/or analgesics, use of assistive devices, weight reduction as appropriate, and activity modification.  Onset of symptoms was gradual starting >10 years ago with gradually worsening course since that time.The patient noted no past surgery on the right hip(s).  Patient currently rates pain in the right hip at 8 out of 10 with activity. Patient has night pain, worsening of pain with activity and weight bearing, trendelenberg gait, pain that interfers with activities of daily living, and pain with passive range of motion. Patient has evidence of periarticular osteophytes and joint space narrowing by imaging studies. This condition presents safety issues increasing the risk of falls.  There is no current active infection.  Patient Active Problem List   Diagnosis Date Noted   GERD 12/19/2007   PEPTIC STRICTURE 12/19/2007   HIATAL HERNIA 12/19/2007   DIVERTICULOSIS, COLON 12/19/2007   IBS 12/19/2007   SHINGLES, HX OF 12/19/2007   Past Medical History:  Diagnosis Date   Hypertension    Pre-diabetes     Past Surgical History:  Procedure Laterality Date   LUMBAR FUSION     TONSILLECTOMY      Current Outpatient Medications  Medication Sig Dispense Refill Last Dose   alendronate (FOSAMAX) 70 MG tablet Take 70 mg by mouth once a week.      amLODipine (NORVASC) 5 MG tablet Take 5 mg by mouth daily.      COVID-19 mRNA bivalent vaccine, Pfizer, (PFIZER COVID-19 VAC BIVALENT) injection Inject into the muscle. 0.3 mL 0    hydrocortisone 2.5 % cream APPLY TWICE DAILY AS NEEDED FOR HEMORRHOID BLEEDING      lidocaine (LIDODERM) 5 % Place 1 patch onto the  skin daily. Remove & Discard patch within 12 hours or as directed by MD 30 patch 0    meclizine (ANTIVERT) 25 MG tablet Take 25 mg by mouth 2 (two) times daily as needed for dizziness.      meloxicam (MOBIC) 7.5 MG tablet Take 7.5 mg by mouth daily.      oxyCODONE (ROXICODONE) 5 MG immediate release tablet Take 1 tablet (5 mg total) by mouth every 4 (four) hours as needed for severe pain. 15 tablet 0    pregabalin (LYRICA) 150 MG capsule TAKE ONE CAPSULE BY MOUTH AT BEDTIME. GENERIC EQUIVALENT FOR LYRICA      psyllium (METAMUCIL) 58.6 % powder Take 1 packet by mouth daily.      venlafaxine XR (EFFEXOR-XR) 75 MG 24 hr capsule TAKE 1 CAPSULE BY MOUTH DAILY WITH FOOD      Vitamin D, Ergocalciferol, (DRISDOL) 1.25 MG (50000 UNIT) CAPS capsule Take 50,000 Units by mouth once a week.      No current facility-administered medications for this visit.   Allergies  Allergen Reactions   Codeine     REACTION: nausea/vomiting   Nsaids Other (See Comments)    GI BLEEDING    Social History   Tobacco Use   Smoking status: Former   Smokeless tobacco: Never  Substance Use Topics   Alcohol use: Never    Family History  Problem Relation Age of Onset   Breast cancer Sister      Review of Systems  Genitourinary:  Positive for frequency.  Musculoskeletal:  Positive for arthralgias.  Hematological:  Bruises/bleeds easily.  Psychiatric/Behavioral:  Positive for dysphoric mood.   All other systems reviewed and are negative.   Objective:  Physical Exam Constitutional:      General: She is not in acute distress.    Appearance: Normal appearance.  HENT:     Head: Normocephalic and atraumatic.  Eyes:     Extraocular Movements: Extraocular movements intact.     Pupils: Pupils are equal, round, and reactive to light.  Cardiovascular:     Rate and Rhythm: Normal rate and regular rhythm.     Pulses: Normal pulses.  Pulmonary:     Effort: Pulmonary effort is normal.     Breath sounds: Normal  breath sounds.  Abdominal:     General: Abdomen is flat. Bowel sounds are normal. There is no distension.     Palpations: Abdomen is soft.     Tenderness: There is no abdominal tenderness.  Musculoskeletal:     Cervical back: Normal range of motion and neck supple.     Right hip: Tenderness and bony tenderness present. Decreased range of motion. Decreased strength.  Lymphadenopathy:     Cervical: No cervical adenopathy.  Skin:    General: Skin is warm and dry.  Neurological:     General: No focal deficit present.     Mental Status: She is alert and oriented to person, place, and time.  Psychiatric:        Mood and Affect: Mood normal.        Behavior: Behavior normal.     Vital signs in last 24 hours: @VSRANGES @  Labs:   Estimated body mass index is 22.14 kg/m as calculated from the following:   Height as of 01/17/20: 5' 3.5" (1.613 m).   Weight as of 01/17/20: 57.6 kg.   Imaging Review Plain radiographs demonstrate severe degenerative joint disease of the right hip(s). The bone quality appears to be good for age and reported activity level.      Assessment/Plan:  End stage arthritis, right hip(s)  The patient history, physical examination, clinical judgement of the provider and imaging studies are consistent with end stage degenerative joint disease of the right hip(s) and total hip arthroplasty is deemed medically necessary. The treatment options including medical management, injection therapy, arthroscopy and arthroplasty were discussed at length. The risks and benefits of total hip arthroplasty were presented and reviewed. The risks due to aseptic loosening, infection, stiffness, dislocation/subluxation,  thromboembolic complications and other imponderables were discussed.  The patient acknowledged the explanation, agreed to proceed with the plan and consent was signed. Patient is being admitted for inpatient treatment for surgery, pain control, PT, OT, prophylactic  antibiotics, VTE prophylaxis, progressive ambulation and ADL's and discharge planning.The patient is planning to be discharged home with home health services   Anticipated LOS equal to or greater than 2 midnights due to - Age 69 and older with one or more of the following:  - Obesity  - Expected need for hospital services (PT, OT, Nursing) required for safe  discharge  - Anticipated need for postoperative skilled nursing care or inpatient rehab  - Active co-morbidities: Diabetes and Respiratory Failure/COPD OR   - Unanticipated findings during/Post Surgery: None  - Patient is a high risk of re-admission due to: None

## 2022-04-28 NOTE — H&P (Signed)
TOTAL HIP ADMISSION H&P  Patient is admitted for right total hip arthroplasty.  Subjective:  Chief Complaint: right hip pain  HPI: Mary Mcdowell, 77 y.o. female, has a history of pain and functional disability in the right hip(s) due to arthritis and patient has failed non-surgical conservative treatments for greater than 12 weeks to include NSAID's and/or analgesics, use of assistive devices, weight reduction as appropriate, and activity modification.  Onset of symptoms was gradual starting >10 years ago with gradually worsening course since that time.The patient noted no past surgery on the right hip(s).  Patient currently rates pain in the right hip at 8 out of 10 with activity. Patient has night pain, worsening of pain with activity and weight bearing, trendelenberg gait, pain that interfers with activities of daily living, and pain with passive range of motion. Patient has evidence of periarticular osteophytes and joint space narrowing by imaging studies. This condition presents safety issues increasing the risk of falls.  There is no current active infection.  Patient Active Problem List   Diagnosis Date Noted   GERD 12/19/2007   PEPTIC STRICTURE 12/19/2007   HIATAL HERNIA 12/19/2007   DIVERTICULOSIS, COLON 12/19/2007   IBS 12/19/2007   SHINGLES, HX OF 12/19/2007   Past Medical History:  Diagnosis Date   Hypertension    Pre-diabetes     Past Surgical History:  Procedure Laterality Date   LUMBAR FUSION     TONSILLECTOMY      Current Outpatient Medications  Medication Sig Dispense Refill Last Dose   alendronate (FOSAMAX) 70 MG tablet Take 70 mg by mouth once a week.      amLODipine (NORVASC) 5 MG tablet Take 5 mg by mouth daily.      COVID-19 mRNA bivalent vaccine, Pfizer, (PFIZER COVID-19 VAC BIVALENT) injection Inject into the muscle. 0.3 mL 0    hydrocortisone 2.5 % cream APPLY TWICE DAILY AS NEEDED FOR HEMORRHOID BLEEDING      lidocaine (LIDODERM) 5 % Place 1 patch onto the  skin daily. Remove & Discard patch within 12 hours or as directed by MD 30 patch 0    meclizine (ANTIVERT) 25 MG tablet Take 25 mg by mouth 2 (two) times daily as needed for dizziness.      meloxicam (MOBIC) 7.5 MG tablet Take 7.5 mg by mouth daily.      oxyCODONE (ROXICODONE) 5 MG immediate release tablet Take 1 tablet (5 mg total) by mouth every 4 (four) hours as needed for severe pain. 15 tablet 0    pregabalin (LYRICA) 150 MG capsule TAKE ONE CAPSULE BY MOUTH AT BEDTIME. GENERIC EQUIVALENT FOR LYRICA      psyllium (METAMUCIL) 58.6 % powder Take 1 packet by mouth daily.      venlafaxine XR (EFFEXOR-XR) 75 MG 24 hr capsule TAKE 1 CAPSULE BY MOUTH DAILY WITH FOOD      Vitamin D, Ergocalciferol, (DRISDOL) 1.25 MG (50000 UNIT) CAPS capsule Take 50,000 Units by mouth once a week.      No current facility-administered medications for this visit.   Allergies  Allergen Reactions   Codeine     REACTION: nausea/vomiting   Nsaids Other (See Comments)    GI BLEEDING    Social History   Tobacco Use   Smoking status: Former   Smokeless tobacco: Never  Substance Use Topics   Alcohol use: Never    Family History  Problem Relation Age of Onset   Breast cancer Sister      Review of Systems    Genitourinary:  Positive for frequency.  Musculoskeletal:  Positive for arthralgias.  Hematological:  Bruises/bleeds easily.  Psychiatric/Behavioral:  Positive for dysphoric mood.   All other systems reviewed and are negative.   Objective:  Physical Exam Constitutional:      General: She is not in acute distress.    Appearance: Normal appearance.  HENT:     Head: Normocephalic and atraumatic.  Eyes:     Extraocular Movements: Extraocular movements intact.     Pupils: Pupils are equal, round, and reactive to light.  Cardiovascular:     Rate and Rhythm: Normal rate and regular rhythm.     Pulses: Normal pulses.  Pulmonary:     Effort: Pulmonary effort is normal.     Breath sounds: Normal  breath sounds.  Abdominal:     General: Abdomen is flat. Bowel sounds are normal. There is no distension.     Palpations: Abdomen is soft.     Tenderness: There is no abdominal tenderness.  Musculoskeletal:     Cervical back: Normal range of motion and neck supple.     Right hip: Tenderness and bony tenderness present. Decreased range of motion. Decreased strength.  Lymphadenopathy:     Cervical: No cervical adenopathy.  Skin:    General: Skin is warm and dry.  Neurological:     General: No focal deficit present.     Mental Status: She is alert and oriented to person, place, and time.  Psychiatric:        Mood and Affect: Mood normal.        Behavior: Behavior normal.     Vital signs in last 24 hours: @VSRANGES @  Labs:   Estimated body mass index is 22.14 kg/m as calculated from the following:   Height as of 01/17/20: 5' 3.5" (1.613 m).   Weight as of 01/17/20: 57.6 kg.   Imaging Review Plain radiographs demonstrate severe degenerative joint disease of the right hip(s). The bone quality appears to be good for age and reported activity level.      Assessment/Plan:  End stage arthritis, right hip(s)  The patient history, physical examination, clinical judgement of the provider and imaging studies are consistent with end stage degenerative joint disease of the right hip(s) and total hip arthroplasty is deemed medically necessary. The treatment options including medical management, injection therapy, arthroscopy and arthroplasty were discussed at length. The risks and benefits of total hip arthroplasty were presented and reviewed. The risks due to aseptic loosening, infection, stiffness, dislocation/subluxation,  thromboembolic complications and other imponderables were discussed.  The patient acknowledged the explanation, agreed to proceed with the plan and consent was signed. Patient is being admitted for inpatient treatment for surgery, pain control, PT, OT, prophylactic  antibiotics, VTE prophylaxis, progressive ambulation and ADL's and discharge planning.The patient is planning to be discharged home with home health services   Anticipated LOS equal to or greater than 2 midnights due to - Age 69 and older with one or more of the following:  - Obesity  - Expected need for hospital services (PT, OT, Nursing) required for safe  discharge  - Anticipated need for postoperative skilled nursing care or inpatient rehab  - Active co-morbidities: Diabetes and Respiratory Failure/COPD OR   - Unanticipated findings during/Post Surgery: None  - Patient is a high risk of re-admission due to: None

## 2022-05-01 NOTE — Patient Instructions (Signed)
SURGICAL WAITING ROOM VISITATION  Patients having surgery or a procedure may have no more than 2 support people in the waiting area - these visitors may rotate.    Children under the age of 57 must have an adult with them who is not the patient.  Due to an increase in RSV and influenza rates and associated hospitalizations, children ages 28 and under may not visit patients in [name] hospitals.  If the patient needs to stay at the hospital during part of their recovery, the visitor guidelines for inpatient rooms apply. Pre-op nurse will coordinate an appropriate time for 1 support person to accompany patient in pre-op.  This support person may not rotate.    Please refer to the [name] website for the visitor guidelines for Inpatients (after your surgery is over and you are in a regular room).       Your procedure is scheduled on: 05-18-22   Report to [name] Main Entrance    Report to admitting at      1000 AM   Call this number if you have problems the morning of surgery [phone]   Do not eat food :After Midnight.   After Midnight you may have the following liquids until _1000_____ AM  DAY OF SURGERY   then nothing by mouth  Water Non-Citrus Juices (without pulp, NO RED-Apple, White grape, White cranberry) Black Coffee (NO MILK/CREAM OR CREAMERS, sugar ok)  Clear Tea (NO MILK/CREAM OR CREAMERS, sugar ok) regular and decaf                             Plain Jell-O (NO RED)                                           Fruit ices (not with fruit pulp, NO RED)                                     Popsicles (NO RED)                                                               Sports drinks like Gatorade (NO RED)                The day of surgery:  Drink ONE (1) Pre-Surgery  G2 at   0930 AM the morning of surgery. Drink in one sitting. Do not sip.  This drink was given to you during your hospital  pre-op appointment visit. Nothing else to drink after  completing the  Pre-Surgery G2.          If you have questions, please contact your surgeon's office.   FOLLOW ANY ADDITIONAL PRE OP INSTRUCTIONS YOU RECEIVED FROM YOUR SURGEON'S OFFICE!!!     Oral Hygiene is also important to reduce your risk of infection.                                    Remember - BRUSH YOUR  TEETH THE MORNING OF SURGERY WITH YOUR REGULAR TOOTHPASTE  DENTURES WILL BE REMOVED PRIOR TO SURGERY PLEASE DO NOT APPLY "Poly grip" OR ADHESIVES!!!   Do NOT smoke after Midnight   Take these medicines the morning of surgery with A SIP OF WATER: amlodipine, venlafaxine, oxycodone if needed  DO NOT TAKE ANY ORAL DIABETIC MEDICATIONS DAY OF YOUR SURGERY  Bring CPAP mask and tubing day of surgery.                              You may not have any metal on your body including hair pins, jewelry, and body piercing             Do not wear make-up, lotions, powders, perfumes/cologne, or deodorant  Do not wear nail polish including gel and S&S, artificial/acrylic nails, or any other type of covering on natural nails including finger and toenails. If you have artificial nails, gel coating, etc. that needs to be removed by a nail salon please have this removed prior to surgery or surgery may need to be canceled/ delayed if the surgeon/ anesthesia feels like they are unable to be safely monitored.   Do not shave  48 hours prior to surgery.                 Do not bring valuables to the hospital. Mary Mcdowell.   Contacts, glasses, dentures or bridgework may not be worn into surgery.   Bring small overnight bag day of surgery.   DO NOT Mary Mcdowell. PHARMACY WILL DISPENSE MEDICATIONS LISTED ON YOUR MEDICATION LIST TO YOU DURING YOUR ADMISSION [place]!    Patients discharged on the day of surgery will not be allowed to drive home.  Someone NEEDS to stay with you for the first 24 hours after  anesthesia.   Special Instructions: Bring a copy of your healthcare power of attorney and living will documents the day of surgery if you haven't scanned them before.              Please read over the following fact sheets you were given: IF [place] [phone]   If you received a COVID test during your pre-op visit  it is requested that you wear a mask when out in public, stay away from anyone that may not be feeling well and notify your surgeon if you develop symptoms. If you test positive for Covid or have been in contact with anyone that has tested positive in the last 10 days please notify you surgeon.     - Preparing for Surgery Before surgery, you can play an important role.  Because skin is not sterile, your skin needs to be as free of germs as possible.  You can reduce the number of germs on your skin by washing with CHG (chlorahexidine gluconate) soap before surgery.  CHG is an antiseptic cleaner which kills germs and bonds with the skin to continue killing germs even after washing. Please DO NOT use if you have an allergy to CHG or antibacterial soaps.  If your skin becomes reddened/irritated stop using the CHG and inform your nurse when you arrive at Short Stay. Do not shave (including legs and underarms) for at least 48 hours prior to the first  CHG shower.  You may shave your face/neck. Please follow these instructions carefully:  1.  Shower with CHG Soap the night before surgery and the  morning of Surgery.  2.  If you choose to wash your hair, wash your hair first as usual with your  normal  shampoo.  3.  After you shampoo, rinse your hair and body thoroughly to remove the  shampoo.                           4.  Use CHG as you would any other liquid soap.  You can apply chg directly  to the skin and wash                       Gently with a scrungie or clean washcloth.  5.  Apply the CHG Soap to your body ONLY FROM THE  NECK DOWN.   Do not use on face/ open                           Wound or open sores. Avoid contact with eyes, ears mouth and genitals (private parts).                       Wash face,  Genitals (private parts) with your normal soap.             6.  Wash thoroughly, paying special attention to the area where your surgery  will be performed.  7.  Thoroughly rinse your body with warm water from the neck down.  8.  DO NOT shower/wash with your normal soap after using and rinsing off  the CHG Soap.                9.  Pat yourself dry with a clean towel.            10.  Wear clean pajamas.            11.  Place clean sheets on your bed the night of your first shower and do not  sleep with pets. Day of Surgery : Do not apply any lotions/deodorants the morning of surgery.  Please wear clean clothes to the hospital/surgery center.  FAILURE TO FOLLOW THESE INSTRUCTIONS MAY RESULT IN THE CANCELLATION OF YOUR SURGERY PATIENT SIGNATURE_________________________________  NURSE SIGNATURE__________________________________  ________________________________________________________________________  Mary Mcdowell  An incentive spirometer is a tool that can help keep your lungs clear and active. This tool measures how well you are filling your lungs with each breath. Taking long deep breaths may help reverse or decrease the chance of developing breathing (pulmonary) problems (especially infection) following: A long period of time when you are unable to move or be active. BEFORE THE PROCEDURE  If the spirometer includes an indicator to show your best effort, your nurse or respiratory therapist will set it to a desired goal. If possible, sit up straight or lean slightly forward. Try not to slouch. Hold the incentive spirometer in an upright position. INSTRUCTIONS FOR USE  Sit on the edge of your bed if possible, or sit up as far as you can in bed or on a chair. Hold the incentive spirometer in an upright  position. Breathe out normally. Place the mouthpiece in your mouth and seal your lips tightly around it. Breathe in slowly and as deeply as possible, raising the piston or the ball toward  the top of the column. Hold your breath for 3-5 seconds or for as long as possible. Allow the piston or ball to fall to the bottom of the column. Remove the mouthpiece from your mouth and breathe out normally. Rest for a few seconds and repeat Steps 1 through 7 at least 10 times every 1-2 hours when you are awake. Take your time and take a few normal breaths between deep breaths. The spirometer may include an indicator to show your best effort. Use the indicator as a goal to work toward during each repetition. After each set of 10 deep breaths, practice coughing to be sure your lungs are clear. If you have an incision (the cut made at the time of surgery), support your incision when coughing by placing a pillow or rolled up towels firmly against it. Once you are able to get out of bed, walk around indoors and cough well. You may stop using the incentive spirometer when instructed by your caregiver.  RISKS AND COMPLICATIONS Take your time so you do not get dizzy or light-headed. If you are in pain, you may need to take or ask for pain medication before doing incentive spirometry. It is harder to take a deep breath if you are having pain. AFTER USE Rest and breathe slowly and easily. It can be helpful to keep track of a log of your progress. Your caregiver can provide you with a simple table to help with this. If you are using the spirometer at home, follow these instructions: [place] IF:  You are having difficultly using the spirometer. You have trouble using the spirometer as often as instructed. Your pain medication is not giving enough relief while using the spirometer. You develop fever of 100.5 F (38.1 C) or higher. SEEK IMMEDIATE MEDICAL CARE IF:  You cough up bloody sputum that had not been  present before. You develop fever of 102 F (38.9 C) or greater. You develop worsening pain at or near the incision site. MAKE SURE YOU:  Understand these instructions. Will watch your condition. Will get help right away if you are not doing well or get worse. Document Released: 08/17/2006 Document Revised: 06/29/2011 Document Reviewed: 10/18/2006 Exit[employer] Patient Information 2014 [employer], [place].   ________________________________________________________________________ WHAT IS A BLOOD TRANSFUSION? Blood Transfusion Information  A transfusion is the replacement of blood or some of its parts. Blood is made up of multiple cells which provide different functions. Red blood cells carry oxygen and are used for blood loss replacement. White blood cells fight against infection. Platelets control bleeding. Plasma helps clot blood. Other blood products are available for specialized needs, such as hemophilia or other clotting disorders. BEFORE THE TRANSFUSION  Who gives blood for transfusions?  Healthy volunteers who are fully evaluated to make sure their blood is safe. This is blood bank blood. Transfusion therapy is the safest it has ever been in the practice of medicine. Before blood is taken from a donor, a complete history is taken to make sure that person has no history of diseases nor engages in risky social behavior (examples are intravenous drug use or sexual activity with multiple partners). The donor's travel history is screened to minimize risk of transmitting infections, such as malaria. The donated blood is tested for signs of infectious diseases, such as HIV and hepatitis. The blood is then tested to be sure it is compatible with you in order to minimize the chance of a transfusion reaction. If you or a relative donates blood, this  is often done in anticipation of surgery and is not appropriate for emergency situations. It takes many days to process the donated blood. RISKS AND  COMPLICATIONS Although transfusion therapy is very safe and saves many lives, the main dangers of transfusion include:  Getting an infectious disease. Developing a transfusion reaction. This is an allergic reaction to something in the blood you were given. Every precaution is taken to prevent this. The decision to have a blood transfusion has been considered carefully by your caregiver before blood is given. Blood is not given unless the benefits outweigh the risks. AFTER THE TRANSFUSION Right after receiving a blood transfusion, you will usually feel much better and more energetic. This is especially true if your red blood cells have gotten low (anemic). The transfusion raises the level of the red blood cells which carry oxygen, and this usually causes an energy increase. The nurse administering the transfusion will monitor you carefully for complications. HOME CARE INSTRUCTIONS  No special instructions are needed after a transfusion. You may find your energy is better. Speak with your caregiver about any limitations on activity for underlying diseases you may have. SEEK MEDICAL CARE IF:  Your condition is not improving after your transfusion. You develop redness or irritation at the intravenous (IV) site. SEEK IMMEDIATE MEDICAL CARE IF:  Any of the following symptoms occur over the next 12 hours: Shaking chills. You have a temperature by mouth above 102 F (38.9 C), not controlled by medicine. Chest, back, or muscle pain. People around you feel you are not acting correctly or are confused. Shortness of breath or difficulty breathing. Dizziness and fainting. You get a rash or develop hives. You have a decrease in urine output. Your urine turns a dark color or changes to pink, red, or brown. Any of the following symptoms occur over the next 10 days: You have a temperature by mouth above 102 F (38.9 C), not controlled by medicine. Shortness of breath. Weakness after normal activity. The  white part of the eye turns yellow (jaundice). You have a decrease in the amount of urine or are urinating less often. Your urine turns a dark color or changes to pink, red, or brown. Document Released: 04/03/2000 Document Revised: 06/29/2011 Document Reviewed: 11/21/2007 [employer] Patient Information 2014 [place], [place].  _______________________________________________________________________

## 2022-05-01 NOTE — Progress Notes (Signed)
PCP -  Cardiologist -   PPM/ICD -  Device Orders -  Rep Notified -   Chest x-ray -  EKG -  Stress Test -  ECHO -  Cardiac Cath -   Sleep Study -  CPAP -   Fasting Blood Sugar -  Checks Blood Sugar _____ times a day  Blood Thinner Instructions: Aspirin Instructions:  ERAS Protcol - PRE-SURGERY Ensure or G2-   COVID TEST-  COVID vaccine -  Activity-- Anesthesia review: HTN, Pre DM  Patient denies shortness of breath, fever, cough and chest pain at PAT appointment   All instructions explained to the patient, with a verbal understanding of the material. Patient agrees to go over the instructions while at home for a better understanding. Patient also instructed to self quarantine after being tested for COVID-19. The opportunity to ask questions was provided.

## 2022-05-05 ENCOUNTER — Encounter (HOSPITAL_COMMUNITY): Payer: Self-pay

## 2022-05-05 ENCOUNTER — Encounter (HOSPITAL_COMMUNITY)
Admission: RE | Admit: 2022-05-05 | Discharge: 2022-05-05 | Disposition: A | Discharge status: Home / Self Care | Payer: Medicare Other | Source: Ambulatory Visit | Attending: Anesthesiology | Admitting: Anesthesiology

## 2022-05-05 ENCOUNTER — Other Ambulatory Visit: Payer: Self-pay

## 2022-05-05 DIAGNOSIS — R7303 Prediabetes: Secondary | ICD-10-CM

## 2022-05-05 DIAGNOSIS — Z01818 Encounter for other preprocedural examination: Secondary | ICD-10-CM

## 2022-05-05 DIAGNOSIS — I10 Essential (primary) hypertension: Secondary | ICD-10-CM

## 2022-05-05 HISTORY — DX: Seborrheic dermatitis, unspecified: L21.9

## 2022-05-05 HISTORY — DX: Unspecified osteoarthritis, unspecified site: M19.90

## 2022-05-05 HISTORY — DX: Depression, unspecified: F32.A

## 2022-05-05 NOTE — Progress Notes (Signed)
Pt. Missed preop was done as phone call. Mary Mcdowell was left message to set up a time with pt. To come for labs,EKG etc. to pick up instructions and G2 drink

## 2022-05-07 ENCOUNTER — Encounter (HOSPITAL_COMMUNITY)
Admission: RE | Admit: 2022-05-07 | Discharge: 2022-05-07 | Disposition: A | Discharge status: Home / Self Care | Payer: Medicare Other | Source: Ambulatory Visit | Attending: Orthopedic Surgery | Admitting: Orthopedic Surgery

## 2022-05-07 DIAGNOSIS — R7303 Prediabetes: Secondary | ICD-10-CM | POA: Diagnosis not present

## 2022-05-07 DIAGNOSIS — I10 Essential (primary) hypertension: Secondary | ICD-10-CM | POA: Diagnosis not present

## 2022-05-07 DIAGNOSIS — Z01818 Encounter for other preprocedural examination: Secondary | ICD-10-CM | POA: Diagnosis not present

## 2022-05-07 DIAGNOSIS — M25551 Pain in right hip: Secondary | ICD-10-CM | POA: Diagnosis not present

## 2022-05-07 DIAGNOSIS — G8929 Other chronic pain: Secondary | ICD-10-CM

## 2022-05-07 LAB — CBC WITH DIFFERENTIAL/PLATELET
Abs Immature Granulocytes: 0.01 10*3/uL (ref 0.00–0.07)
Basophils Absolute: 0.1 10*3/uL (ref 0.0–0.1)
Basophils Relative: 1 %
Eosinophils Absolute: 0.2 10*3/uL (ref 0.0–0.5)
Eosinophils Relative: 3 %
HCT: 44.7 % (ref 36.0–46.0)
Hemoglobin: 14.4 g/dL (ref 12.0–15.0)
Immature Granulocytes: 0 %
Lymphocytes Relative: 28 %
Lymphs Abs: 2 10*3/uL (ref 0.7–4.0)
MCH: 29.9 pg (ref 26.0–34.0)
MCHC: 32.2 g/dL (ref 30.0–36.0)
MCV: 92.9 fL (ref 80.0–100.0)
Monocytes Absolute: 0.4 10*3/uL (ref 0.1–1.0)
Monocytes Relative: 6 %
Neutro Abs: 4.4 10*3/uL (ref 1.7–7.7)
Neutrophils Relative %: 62 %
Platelets: 271 10*3/uL (ref 150–400)
RBC: 4.81 MIL/uL (ref 3.87–5.11)
RDW: 13.5 % (ref 11.5–15.5)
WBC: 7.1 10*3/uL (ref 4.0–10.5)
nRBC: 0 % (ref 0.0–0.2)

## 2022-05-07 LAB — COMPREHENSIVE METABOLIC PANEL
ALT: 26 U/L (ref 0–44)
AST: 29 U/L (ref 15–41)
Albumin: 4.8 g/dL (ref 3.5–5.0)
Alkaline Phosphatase: 81 U/L (ref 38–126)
Anion gap: 11 (ref 5–15)
BUN: 20 mg/dL (ref 8–23)
CO2: 27 mmol/L (ref 22–32)
Calcium: 10.2 mg/dL (ref 8.9–10.3)
Chloride: 103 mmol/L (ref 98–111)
Creatinine, Ser: 0.52 mg/dL (ref 0.44–1.00)
GFR, Estimated: >60 mL/min (ref >60)
Glucose, Bld: 96 mg/dL (ref 70–99)
Potassium: 4.8 mmol/L (ref 3.5–5.1)
Sodium: 141 mmol/L (ref 135–145)
Total Bilirubin: 0.4 mg/dL (ref 0.3–1.2)
Total Protein: 7.6 g/dL (ref 6.5–8.1)

## 2022-05-07 LAB — TYPE AND SCREEN
ABO/RH(D): A POS
Antibody Screen: NEGATIVE

## 2022-05-07 LAB — HEMOGLOBIN A1C
Hgb A1c MFr Bld: 5.8 % — ABNORMAL HIGH (ref 4.8–5.6)
Mean Plasma Glucose: 119.76 mg/dL

## 2022-05-07 LAB — SURGICAL PCR SCREEN
MRSA, PCR: NEGATIVE
Staphylococcus aureus: NEGATIVE

## 2022-05-07 LAB — GLUCOSE, CAPILLARY: Glucose-Capillary: 99 mg/dL (ref 70–99)

## 2022-05-17 MED ORDER — TRANEXAMIC ACID 1000 MG/10ML IV SOLN
2000.0000 mg | INTRAVENOUS | Status: DC
  Filled 2022-05-17: qty 20

## 2022-05-18 ENCOUNTER — Observation Stay (HOSPITAL_COMMUNITY)
Admission: RE | Admit: 2022-05-18 | Discharge: 2022-05-20 | Disposition: A | Discharge status: Home Health | Payer: Medicare Other | Source: Ambulatory Visit | Attending: Orthopedic Surgery | Admitting: Orthopedic Surgery

## 2022-05-18 ENCOUNTER — Encounter (HOSPITAL_COMMUNITY): Payer: Self-pay | Admitting: Orthopedic Surgery

## 2022-05-18 ENCOUNTER — Ambulatory Visit (HOSPITAL_BASED_OUTPATIENT_CLINIC_OR_DEPARTMENT_OTHER): Payer: Medicare Other | Admitting: Certified Registered Nurse Anesthetist

## 2022-05-18 ENCOUNTER — Other Ambulatory Visit: Payer: Self-pay

## 2022-05-18 ENCOUNTER — Ambulatory Visit (HOSPITAL_COMMUNITY): Payer: Medicare Other | Admitting: Certified Registered Nurse Anesthetist

## 2022-05-18 ENCOUNTER — Ambulatory Visit (HOSPITAL_COMMUNITY): Payer: Medicare Other

## 2022-05-18 ENCOUNTER — Encounter (HOSPITAL_COMMUNITY)
Admission: RE | Disposition: A | Discharge status: Home Health | Payer: Self-pay | Source: Ambulatory Visit | Attending: Orthopedic Surgery

## 2022-05-18 ENCOUNTER — Observation Stay (HOSPITAL_COMMUNITY): Payer: Medicare Other

## 2022-05-18 DIAGNOSIS — Z79899 Other long term (current) drug therapy: Secondary | ICD-10-CM | POA: Diagnosis not present

## 2022-05-18 DIAGNOSIS — Z87891 Personal history of nicotine dependence: Secondary | ICD-10-CM | POA: Insufficient documentation

## 2022-05-18 DIAGNOSIS — I10 Essential (primary) hypertension: Secondary | ICD-10-CM | POA: Diagnosis not present

## 2022-05-18 DIAGNOSIS — M1611 Unilateral primary osteoarthritis, right hip: Principal | ICD-10-CM | POA: Insufficient documentation

## 2022-05-18 DIAGNOSIS — R7303 Prediabetes: Secondary | ICD-10-CM

## 2022-05-18 DIAGNOSIS — Z96641 Presence of right artificial hip joint: Secondary | ICD-10-CM | POA: Diagnosis not present

## 2022-05-18 DIAGNOSIS — Z471 Aftercare following joint replacement surgery: Secondary | ICD-10-CM | POA: Diagnosis not present

## 2022-05-18 HISTORY — PX: TOTAL HIP ARTHROPLASTY: SHX124

## 2022-05-18 LAB — ABO/RH: ABO/RH(D): A POS

## 2022-05-18 SURGERY — ARTHROPLASTY, HIP, TOTAL,POSTERIOR APPROACH
Anesthesia: Spinal | Site: Hip | Laterality: Right

## 2022-05-18 MED ORDER — ZOLPIDEM TARTRATE 5 MG PO TABS: 5.0000 mg | ORAL_TABLET | Freq: Every evening | ORAL | Status: DC | PRN

## 2022-05-18 MED ORDER — ONDANSETRON HCL 4 MG/2ML IJ SOLN
INTRAMUSCULAR | Status: DC | PRN
  Administered 2022-05-18: 4 mg via INTRAVENOUS

## 2022-05-18 MED ORDER — VENLAFAXINE HCL ER 37.5 MG PO CP24
75.0000 mg | ORAL_CAPSULE | Freq: Every day | ORAL | Status: DC
  Administered 2022-05-19 – 2022-05-20 (×2): 75 mg via ORAL
  Filled 2022-05-18 (×2): qty 2

## 2022-05-18 MED ORDER — MENTHOL 3 MG MT LOZG: 1.0000 | LOZENGE | OROMUCOSAL | Status: DC | PRN

## 2022-05-18 MED ORDER — LORATADINE 10 MG PO TABS
10.0000 mg | ORAL_TABLET | Freq: Every day | ORAL | Status: DC
  Administered 2022-05-19 – 2022-05-20 (×2): 10 mg via ORAL
  Filled 2022-05-18 (×2): qty 1

## 2022-05-18 MED ORDER — ISOPROPYL ALCOHOL 70 % SOLN
Status: DC | PRN
  Administered 2022-05-18: 1 via TOPICAL

## 2022-05-18 MED ORDER — PHENYLEPHRINE HCL (PRESSORS) 10 MG/ML IV SOLN
INTRAVENOUS | Status: AC
  Filled 2022-05-18: qty 1

## 2022-05-18 MED ORDER — PREGABALIN 75 MG PO CAPS
150.0000 mg | ORAL_CAPSULE | Freq: Every day | ORAL | Status: DC
  Administered 2022-05-18 – 2022-05-19 (×2): 150 mg via ORAL
  Filled 2022-05-18 (×2): qty 2

## 2022-05-18 MED ORDER — ONDANSETRON HCL 4 MG/2ML IJ SOLN: 4.0000 mg | Freq: Once | INTRAMUSCULAR | Status: DC | PRN

## 2022-05-18 MED ORDER — ACETAMINOPHEN 500 MG PO TABS
1000.0000 mg | ORAL_TABLET | Freq: Four times a day (QID) | ORAL | Status: AC
  Administered 2022-05-18 – 2022-05-19 (×4): 1000 mg via ORAL
  Filled 2022-05-18 (×4): qty 2

## 2022-05-18 MED ORDER — 0.9 % SODIUM CHLORIDE (POUR BTL) OPTIME
TOPICAL | Status: DC | PRN
  Administered 2022-05-18: 1000 mL

## 2022-05-18 MED ORDER — AMLODIPINE BESYLATE 5 MG PO TABS
5.0000 mg | ORAL_TABLET | Freq: Every day | ORAL | Status: DC
  Administered 2022-05-19 – 2022-05-20 (×2): 5 mg via ORAL
  Filled 2022-05-18 (×2): qty 1

## 2022-05-18 MED ORDER — ACETAMINOPHEN 325 MG PO TABS
325.0000 mg | ORAL_TABLET | Freq: Four times a day (QID) | ORAL | Status: DC | PRN
  Administered 2022-05-19 – 2022-05-20 (×2): 650 mg via ORAL
  Filled 2022-05-18 (×2): qty 2

## 2022-05-18 MED ORDER — SODIUM CHLORIDE (PF) 0.9 % IJ SOLN
INTRAMUSCULAR | Status: DC | PRN
  Administered 2022-05-18: 60 mL via INTRAVENOUS

## 2022-05-18 MED ORDER — BUPIVACAINE LIPOSOME 1.3 % IJ SUSP
INTRAMUSCULAR | Status: AC
  Filled 2022-05-18: qty 20

## 2022-05-18 MED ORDER — DIPHENHYDRAMINE HCL 12.5 MG/5ML PO ELIX: 12.5000 mg | ORAL_SOLUTION | ORAL | Status: DC | PRN

## 2022-05-18 MED ORDER — WATER FOR IRRIGATION, STERILE IR SOLN
Status: DC | PRN
  Administered 2022-05-18: 2000 mL

## 2022-05-18 MED ORDER — VITAMIN B-12 1000 MCG PO TABS
1000.0000 ug | ORAL_TABLET | Freq: Every day | ORAL | Status: DC
  Administered 2022-05-19 – 2022-05-20 (×2): 1000 ug via ORAL
  Filled 2022-05-18 (×2): qty 1

## 2022-05-18 MED ORDER — PROPOFOL 500 MG/50ML IV EMUL
INTRAVENOUS | Status: DC | PRN
  Administered 2022-05-18: 75 ug/kg/min via INTRAVENOUS

## 2022-05-18 MED ORDER — FENTANYL CITRATE PF 50 MCG/ML IJ SOSY: 25.0000 ug | PREFILLED_SYRINGE | INTRAMUSCULAR | Status: DC | PRN

## 2022-05-18 MED ORDER — PROPOFOL 10 MG/ML IV BOLUS
INTRAVENOUS | Status: DC | PRN
  Administered 2022-05-18: 30 mg via INTRAVENOUS

## 2022-05-18 MED ORDER — PANTOPRAZOLE SODIUM 40 MG PO TBEC
40.0000 mg | DELAYED_RELEASE_TABLET | Freq: Every day | ORAL | Status: DC
  Administered 2022-05-18 – 2022-05-20 (×3): 40 mg via ORAL
  Filled 2022-05-18 (×3): qty 1

## 2022-05-18 MED ORDER — LACTATED RINGERS IV SOLN: INTRAVENOUS | Status: DC

## 2022-05-18 MED ORDER — PHENYLEPHRINE HCL-NACL 20-0.9 MG/250ML-% IV SOLN
INTRAVENOUS | Status: DC | PRN
  Administered 2022-05-18: 25 ug/min via INTRAVENOUS

## 2022-05-18 MED ORDER — ADULT MULTIVITAMIN W/MINERALS CH
1.0000 | ORAL_TABLET | Freq: Every day | ORAL | Status: DC
  Administered 2022-05-19 – 2022-05-20 (×2): 1 via ORAL
  Filled 2022-05-18 (×2): qty 1

## 2022-05-18 MED ORDER — PHENOL 1.4 % MT LIQD: 1.0000 | OROMUCOSAL | Status: DC | PRN

## 2022-05-18 MED ORDER — ACETAMINOPHEN 500 MG PO TABS
1000.0000 mg | ORAL_TABLET | Freq: Once | ORAL | Status: AC
  Administered 2022-05-18: 1000 mg via ORAL
  Filled 2022-05-18: qty 2

## 2022-05-18 MED ORDER — ONDANSETRON HCL 4 MG/2ML IJ SOLN
4.0000 mg | Freq: Four times a day (QID) | INTRAMUSCULAR | Status: DC | PRN
  Administered 2022-05-19: 4 mg via INTRAVENOUS
  Filled 2022-05-18: qty 2

## 2022-05-18 MED ORDER — FLUTICASONE PROPIONATE 50 MCG/ACT NA SUSP: 1.0000 | Freq: Two times a day (BID) | NASAL | Status: DC | PRN

## 2022-05-18 MED ORDER — SODIUM CHLORIDE (PF) 0.9 % IJ SOLN
INTRAMUSCULAR | Status: AC
  Filled 2022-05-18: qty 50

## 2022-05-18 MED ORDER — SODIUM CHLORIDE 0.9 % IR SOLN
Status: DC | PRN
  Administered 2022-05-18: 1000 mL

## 2022-05-18 MED ORDER — POVIDONE-IODINE 10 % EX SWAB: 2.0000 | Freq: Once | CUTANEOUS | Status: DC

## 2022-05-18 MED ORDER — OXYCODONE HCL 5 MG PO TABS
5.0000 mg | ORAL_TABLET | ORAL | Status: DC | PRN
  Administered 2022-05-19 (×2): 5 mg via ORAL
  Administered 2022-05-19: 10 mg via ORAL
  Filled 2022-05-18: qty 1
  Filled 2022-05-18 (×2): qty 2

## 2022-05-18 MED ORDER — BUPIVACAINE LIPOSOME 1.3 % IJ SUSP
INTRAMUSCULAR | Status: DC | PRN
  Administered 2022-05-18: 20 mL

## 2022-05-18 MED ORDER — CEFAZOLIN SODIUM-DEXTROSE 2-4 GM/100ML-% IV SOLN
2.0000 g | INTRAVENOUS | Status: AC
  Administered 2022-05-18: 2 g via INTRAVENOUS
  Filled 2022-05-18: qty 100

## 2022-05-18 MED ORDER — AMISULPRIDE (ANTIEMETIC) 5 MG/2ML IV SOLN: 10.0000 mg | Freq: Once | INTRAVENOUS | Status: DC | PRN

## 2022-05-18 MED ORDER — BUPIVACAINE IN DEXTROSE 0.75-8.25 % IT SOLN
INTRATHECAL | Status: DC | PRN
  Administered 2022-05-18: 1.8 mL via INTRATHECAL

## 2022-05-18 MED ORDER — DOCUSATE SODIUM 100 MG PO CAPS
100.0000 mg | ORAL_CAPSULE | Freq: Two times a day (BID) | ORAL | Status: DC
  Administered 2022-05-18 – 2022-05-20 (×4): 100 mg via ORAL
  Filled 2022-05-18 (×4): qty 1

## 2022-05-18 MED ORDER — POLYETHYLENE GLYCOL 3350 17 G PO PACK: 17.0000 g | PACK | Freq: Every day | ORAL | Status: DC | PRN

## 2022-05-18 MED ORDER — ORAL CARE MOUTH RINSE: 15.0000 mL | Freq: Once | OROMUCOSAL | Status: AC

## 2022-05-18 MED ORDER — SODIUM CHLORIDE (PF) 0.9 % IJ SOLN
INTRAMUSCULAR | Status: AC
  Filled 2022-05-18: qty 10

## 2022-05-18 MED ORDER — TRANEXAMIC ACID-NACL 1000-0.7 MG/100ML-% IV SOLN
1000.0000 mg | INTRAVENOUS | Status: AC
  Administered 2022-05-18: 1000 mg via INTRAVENOUS
  Filled 2022-05-18: qty 100

## 2022-05-18 MED ORDER — HYDROMORPHONE HCL 1 MG/ML IJ SOLN
0.5000 mg | INTRAMUSCULAR | Status: DC | PRN
  Administered 2022-05-18: 1 mg via INTRAVENOUS
  Filled 2022-05-18 (×2): qty 1

## 2022-05-18 MED ORDER — DEXAMETHASONE SODIUM PHOSPHATE 10 MG/ML IJ SOLN
INTRAMUSCULAR | Status: DC | PRN
  Administered 2022-05-18: 10 mg via INTRAVENOUS

## 2022-05-18 MED ORDER — SODIUM CHLORIDE 0.9 % IR SOLN
Status: DC | PRN
  Administered 2022-05-18: 3000 mL

## 2022-05-18 MED ORDER — TIZANIDINE HCL 4 MG PO TABS
4.0000 mg | ORAL_TABLET | Freq: Four times a day (QID) | ORAL | Status: DC | PRN
  Administered 2022-05-19 (×2): 4 mg via ORAL
  Filled 2022-05-18 (×2): qty 1

## 2022-05-18 MED ORDER — SENNA 8.6 MG PO TABS
1.0000 | ORAL_TABLET | Freq: Two times a day (BID) | ORAL | Status: DC
  Administered 2022-05-18 – 2022-05-20 (×4): 8.6 mg via ORAL
  Filled 2022-05-18 (×4): qty 1

## 2022-05-18 MED ORDER — CHLORHEXIDINE GLUCONATE 0.12 % MT SOLN
15.0000 mL | Freq: Once | OROMUCOSAL | Status: AC
  Administered 2022-05-18: 15 mL via OROMUCOSAL

## 2022-05-18 MED ORDER — BUPIVACAINE LIPOSOME 1.3 % IJ SUSP: 10.0000 mL | Freq: Once | INTRAMUSCULAR | Status: DC

## 2022-05-18 MED ORDER — CEFAZOLIN SODIUM-DEXTROSE 2-4 GM/100ML-% IV SOLN
2.0000 g | Freq: Four times a day (QID) | INTRAVENOUS | Status: AC
  Administered 2022-05-18 – 2022-05-19 (×2): 2 g via INTRAVENOUS
  Filled 2022-05-18 (×2): qty 100

## 2022-05-18 MED ORDER — ASPIRIN 81 MG PO CHEW
81.0000 mg | CHEWABLE_TABLET | Freq: Two times a day (BID) | ORAL | Status: DC
  Administered 2022-05-18 – 2022-05-20 (×4): 81 mg via ORAL
  Filled 2022-05-18 (×4): qty 1

## 2022-05-18 MED ORDER — ONDANSETRON HCL 4 MG PO TABS: 4.0000 mg | ORAL_TABLET | Freq: Four times a day (QID) | ORAL | Status: DC | PRN

## 2022-05-18 SURGICAL SUPPLY — 65 items
BAG COUNTER SPONGE SURGICOUNT (BAG) IMPLANT
BAG DECANTER FOR FLEXI CONT (MISCELLANEOUS) ×1 IMPLANT
BAG ZIPLOCK 12X15 (MISCELLANEOUS) ×1 IMPLANT
BLADE SAW SAG 25X90X1.19 (BLADE) ×1 IMPLANT
CHLORAPREP W/TINT 26 (MISCELLANEOUS) ×2 IMPLANT
COVER SURGICAL LIGHT HANDLE (MISCELLANEOUS) ×1 IMPLANT
DERMABOND ADVANCED .7 DNX12 (GAUZE/BANDAGES/DRESSINGS) ×1 IMPLANT
DRAPE HIP W/POCKET STRL (MISCELLANEOUS) ×1 IMPLANT
DRAPE INCISE IOBAN 66X45 STRL (DRAPES) ×1 IMPLANT
DRAPE INCISE IOBAN 85X60 (DRAPES) ×1 IMPLANT
DRAPE POUCH INSTRU U-SHP 10X18 (DRAPES) ×1 IMPLANT
DRAPE SHEET LG 3/4 BI-LAMINATE (DRAPES) ×3 IMPLANT
DRAPE SURG 17X11 SM STRL (DRAPES) ×1 IMPLANT
DRAPE U-SHAPE 47X51 STRL (DRAPES) ×2 IMPLANT
DRESSING AQUACEL AG SP 3.5X10 (GAUZE/BANDAGES/DRESSINGS) ×1 IMPLANT
DRSG AQUACEL AG ADV 3.5X10 (GAUZE/BANDAGES/DRESSINGS) IMPLANT
DRSG AQUACEL AG SP 3.5X10 (GAUZE/BANDAGES/DRESSINGS) ×1
ELECT BLADE TIP CTD 4 INCH (ELECTRODE) ×1 IMPLANT
ELECT REM PT RETURN 15FT ADLT (MISCELLANEOUS) ×1 IMPLANT
GLOVE BIO SURGEON STRL SZ 6.5 (GLOVE) ×2 IMPLANT
GLOVE BIOGEL PI IND STRL 6.5 (GLOVE) ×1 IMPLANT
GLOVE BIOGEL PI IND STRL 8 (GLOVE) ×1 IMPLANT
GLOVE SURG ORTHO 8.0 STRL STRW (GLOVE) ×2 IMPLANT
GOWN STRL REUS W/ TWL XL LVL3 (GOWN DISPOSABLE) ×2 IMPLANT
GOWN STRL REUS W/TWL XL LVL3 (GOWN DISPOSABLE) ×2
HANDPIECE INTERPULSE COAX TIP (DISPOSABLE)
HEAD CERAMIC FEMORAL 36MM (Head) IMPLANT
HOLDER FOLEY CATH W/STRAP (MISCELLANEOUS) ×1 IMPLANT
HOOD PEEL AWAY T7 (MISCELLANEOUS) ×3 IMPLANT
INSERT 0 DEGREE 36 (Miscellaneous) IMPLANT
JET LAVAGE IRRISEPT WOUND (IRRIGATION / IRRIGATOR)
KIT BASIN OR (CUSTOM PROCEDURE TRAY) ×1 IMPLANT
KIT TURNOVER KIT A (KITS) IMPLANT
LAVAGE JET IRRISEPT WOUND (IRRIGATION / IRRIGATOR) IMPLANT
MANIFOLD NEPTUNE II (INSTRUMENTS) ×1 IMPLANT
MARKER SKIN DUAL TIP RULER LAB (MISCELLANEOUS) ×1 IMPLANT
NEEDLE HYPO 22GX1.5 SAFETY (NEEDLE) IMPLANT
NS IRRIG 1000ML POUR BTL (IV SOLUTION) ×1 IMPLANT
PACK TOTAL JOINT (CUSTOM PROCEDURE TRAY) ×1 IMPLANT
PRESSURIZER FEMORAL UNIV (MISCELLANEOUS) IMPLANT
PROTECTOR NERVE ULNAR (MISCELLANEOUS) ×1 IMPLANT
RETRIEVER SUT HEWSON (MISCELLANEOUS) ×1 IMPLANT
SCREW HEX LP 6.5X15 (Screw) IMPLANT
SCREW HEX LP 6.5X25 (Screw) IMPLANT
SEALER BIPOLAR AQUA 6.0 (INSTRUMENTS) ×1 IMPLANT
SET HNDPC FAN SPRY TIP SCT (DISPOSABLE) IMPLANT
SHELL TRIDENT II CLUST 50 (Shell) IMPLANT
SPIKE FLUID TRANSFER (MISCELLANEOUS) ×3 IMPLANT
STEM HIP 127 DEG (Stem) IMPLANT
SUCTION FRAZIER HANDLE 12FR (TUBING) ×1
SUCTION TUBE FRAZIER 12FR DISP (TUBING) ×1 IMPLANT
SUT BONE WAX W31G (SUTURE) ×1 IMPLANT
SUT ETHIBOND #5 BRAIDED 30INL (SUTURE) ×1 IMPLANT
SUT MNCRL AB 3-0 PS2 18 (SUTURE) ×1 IMPLANT
SUT STRATAFIX 0 PDS 27 VIOLET (SUTURE) ×1
SUT STRATAFIX PDO 1 14 VIOLET (SUTURE) ×1
SUT STRATFX PDO 1 14 VIOLET (SUTURE) ×1
SUT VIC AB 2-0 CT2 27 (SUTURE) ×2 IMPLANT
SUTURE STRATFX 0 PDS 27 VIOLET (SUTURE) ×1 IMPLANT
SUTURE STRATFX PDO 1 14 VIOLET (SUTURE) ×1 IMPLANT
SYR 20ML LL LF (SYRINGE) ×2 IMPLANT
TOWEL OR 17X26 10 PK STRL BLUE (TOWEL DISPOSABLE) ×1 IMPLANT
TRAY FOLEY MTR SLVR 16FR STAT (SET/KITS/TRAYS/PACK) ×1 IMPLANT
UNDERPAD 30X36 HEAVY ABSORB (UNDERPADS AND DIAPERS) ×1 IMPLANT
WATER STERILE IRR 1000ML POUR (IV SOLUTION) ×2 IMPLANT

## 2022-05-18 NOTE — Anesthesia Procedure Notes (Signed)
Spinal  Patient location during procedure: OR Start time: 05/18/2022 1:42 PM End time: 05/18/2022 1:47 PM Reason for block: surgical anesthesia Staffing Performed: anesthesiologist  Anesthesiologist: Lynda Rainwater, MD Performed by: Lynda Rainwater, MD Authorized by: Lynda Rainwater, MD   Preanesthetic Checklist Completed: patient identified, IV checked, site marked, risks and benefits discussed, surgical consent, monitors and equipment checked, pre-op evaluation and timeout performed Spinal Block Patient position: sitting Prep: DuraPrep Patient monitoring: heart rate, cardiac monitor, continuous pulse ox and blood pressure Approach: midline Location: L3-4 Injection technique: single-shot Needle Needle type: Quincke  Needle gauge: 22 G Needle length: 9 cm Assessment Sensory level: T4 Events: CSF return and second provider

## 2022-05-18 NOTE — Interval H&P Note (Signed)
The patient has been re-examined, and the chart reviewed, and there have been no interval changes to the documented history and physical.    Plan for R THA for R hip OA  The operative side was examined and the patient was confirmed to have sensation to DPN, SPN, TN intact, Motor EHL, ext, flex 5/5, and DP 2+, PT 2+, No significant edema.   The risks, benefits, and alternatives have been discussed at length with patient, and the patient is willing to proceed.  Right hip marked. Consent has been signed.  

## 2022-05-18 NOTE — Op Note (Signed)
05/18/2022  3:22 PM  PATIENT:  Mary Mcdowell   MRN: [id number]  PRE-OPERATIVE DIAGNOSIS: End-stage right hip osteoarthritis  POST-OPERATIVE DIAGNOSIS:  same  PROCEDURE:  Procedure(s): TOTAL HIP ARTHROPLASTY  PREOPERATIVE INDICATIONS:   Mary Mcdowell is an 77 y.o. female who has a diagnosis of end-stage right hip osteoarthritis and elected for surgical management after failing conservative treatment.  The risks benefits and alternatives were discussed with the patient including but not limited to the risks of nonoperative treatment, versus surgical intervention including infection, bleeding, nerve injury, periprosthetic fracture, the need for revision surgery, dislocation, leg length discrepancy, blood clots, cardiopulmonary complications, morbidity, mortality, among others, and they were willing to proceed.     OPERATIVE REPORT     SURGEON:  Charlies Constable, MD    ASSISTANT: Izola Price, RNFA, (Present throughout the entire procedure,  necessary for completion of procedure in a timely manner, assisting with retraction, instrumentation, and closure)     ANESTHESIA: Spinal  ESTIMATED BLOOD LOSS: 474QV    COMPLICATIONS:  None.     COMPONENTS:   Stryker Trident 2 size 59mm acetabular shell, 6.5 hex screws x 2, Trident X.3 neutral polyethylene liner, size 5 Accolade 2 with 127 degree neck angle, 36+30mm ceramic head Implant Name Type Inv. Item Serial No. Manufacturer Lot No. LRB No. Used Action  SHELL TRIDENT II CLUST 50 - [id number] Shell SHELL TRIDENT II CLUST 50  [employer] [id number] A Right 1 Implanted  INSERT 0 DEGREE 36 - [id number] Miscellaneous INSERT 0 DEGREE 36  [employer] [id number] Right 1 Implanted  SCREW HEX LP 6.5X25 - [id number] Screw SCREW HEX LP 6.5X25  [employer] [id number] Right 1 Implanted  STEM HIP 127 DEG - [id number] Stem STEM HIP 127 DEG  [employer] [id number] A Right 1 Implanted  HEAD CERAMIC FEMORAL 36MM - [id number] Head HEAD CERAMIC  FEMORAL 36MM  [employer] [id number] Right 1 Implanted      PROCEDURE IN DETAIL:   The patient was met in the holding area and  identified.  The appropriate hip was identified and marked at the operative site.  The patient was then transported to the OR  and  placed under anesthesia.  At that point, the patient was  placed in the lateral decubitus position with the operative side up and  secured to the operating room table  and all bony prominences padded. A subaxillary role was also placed.    The operative lower extremity was prepped from the iliac crest to the distal leg.  Sterile draping was performed.  Preoperative antibiotics, 2 gm of ancef,1 gm of Tranexamic Acid, and 8 mg of Decadron administered. Time out was performed prior to incision.      A routine posterolateral approach was utilized via sharp dissection  carried down to the subcutaneous tissue.  Gross bleeders were Bovie coagulated.  The iliotibial band was identified and incised along the length of the skin incision through the glute max fascia.  Charnley retractor was placed with care to protect the sciatic nerve posteriorly.  With the hip internally rotated, the piriformis tendon was identified and released from the femoral insertion and tagged with a #5 Ethibond.  A capsulotomy was then performed off the femoral insertion and also tagged with a #5 Ethibond.    The femoral neck was exposed, and I resected the femoral neck based on preoperative templating relative to the lesser trochanter.    I then exposed the deep acetabulum, cleared out any tissue including the ligamentum teres.  After adequate visualization, I excised the labrum.  I then started reaming with a 44 mm reamer, first medializing to the floor of the cotyloid fossa, and then in the position of the cup aiming towards the greater sciatic notch, matching the version of the transverse acetabular ligament and tucked under the anterior wall. I reamed up to 50 mm reamer  with good bony bed preparation and a 50 mm cup was chosen.  The real cup was then impacted into place.  Appropriate version and inclination was confirmed clinically matching their bony anatomy, and also with the use of the jig.  I placed 2 screws in the posterior superior quadrant to augment fixation.  A neutral liner was placed and impacted. It was confirmed to be appropriately seated and the acetabular retractors were removed.    I then prepared the proximal femur using the box cutter, Charnley awl, and then sequentially broached starting with 0 up to a size 5.  A trial broach, neck, and head was utilized, and I reduced the hip and it was found to have excellent stability.  There was no impingement with full extension and 90 degrees external rotation.  The hip was stable at the position of sleep and with 90 degrees flexion and 90degrees of internal rotation.  Leg lengths were also clinically assessed in the lateral position and felt to be equal. Intra-Op flatplate was obtained and confirmed appropriate component positions.  Good fill of the femur with the size 5 broach.  And restoration of leg length and offset. No evidence or concern for fracture.  A final femoral prosthesis size 5 was selected. I then impacted the real femoral prosthesis into place.I again trialed and selected a 36+32mm ball. The hip was then reduced and taken through a range of motion. There was no impingement with full extension and 90 degrees external rotation.  The hip was stable at the position of sleep and with 90 degrees flexion and 90degrees of internal rotation. Leg lengths were  again assessed and felt to be restored.  We then opened, and I impacted the real head ball into place.  The posterior capsule was then closed with #5 Ethibond.  The piriformis was repaired through the base of the abductor tendon using a [place] suture passer.  I then irrigated the hip copiously with dilute Betadine and with normal saline pulse  lavage. Periarticular injection was then performed with Exparel.   We repaired the fascia #1 barbed suture, followed by 0 barbed suture for the subcutaneous fat.  Skin was closed with 2-0 Vicryl and 3-0 Monocryl.  Dermabond and Aquacel dressing were applied. The patient was then awakened and returned to PACU in stable and satisfactory condition.  Leg lengths in the supine position were assessed and felt to be clinically equal. There were no complications.  Post op recs: WB: WBAT RLE, No formal hip precautions Abx: ancef Imaging: PACU pelvis Xray Dressing: Aquacell, keep intact until follow up DVT prophylaxis: Aspirin 81BID starting POD1 Follow up: 2 weeks after surgery for a wound check with Dr. Zachery Dakins at [facility].  Address: [address], [place], [place] [address]  Office Phone: [phone]   Charlies Constable, MD Orthopedic Surgeon

## 2022-05-18 NOTE — Transfer of Care (Signed)
Immediate Anesthesia Transfer of Care Note  Patient: Mary Mcdowell  Procedure(s) Performed: TOTAL HIP ARTHROPLASTY (Right: Hip)  Patient Location: PACU  Anesthesia Type:Spinal  Level of Consciousness: awake, alert , and oriented  Airway & Oxygen Therapy: Patient Spontanous Breathing and Patient connected to face mask oxygen  Post-op Assessment: Report given to RN and Post -op Vital signs reviewed and stable  Post vital signs: Reviewed and stable  Last Vitals:  Vitals Value Taken Time  BP 92/46 05/18/22 1553  Temp    Pulse 62 05/18/22 1555  Resp 20 05/18/22 1555  SpO2 94 % 05/18/22 1555  Vitals shown include unvalidated device data.  Last Pain:  Vitals:   05/18/22 1126  TempSrc:   PainSc: 2       Patients Stated Pain Goal: 0 (80/99/83 3825)  Complications: No notable events documented.

## 2022-05-18 NOTE — Discharge Instructions (Signed)
INSTRUCTIONS AFTER JOINT REPLACEMENT   Remove items at home which could result in a fall. This includes throw rugs or furniture in walking pathways ICE to the affected joint every three hours while awake for 30 minutes at a time, for at least the first 3-5 days, and then as needed for pain and swelling.  Continue to use ice for pain and swelling. You may notice swelling that will progress down to the foot and ankle.  This is normal after surgery.  Elevate your leg when you are not up walking on it.   Continue to use the breathing machine you got in the hospital (incentive spirometer) which will help keep your temperature down.  It is common for your temperature to cycle up and down following surgery, especially at night when you are not up moving around and exerting yourself.  The breathing machine keeps your lungs expanded and your temperature down.   DIET:  As you were doing prior to hospitalization, we recommend a well-balanced diet.  DRESSING / WOUND CARE / SHOWERING  Keep the surgical dressing until follow up.  The dressing is water proof, so you can shower without any extra covering.  IF THE DRESSING FALLS OFF or the wound gets wet inside, change the dressing with sterile gauze.  Please use good hand washing techniques before changing the dressing.  Do not use any lotions or creams on the incision until instructed by your surgeon.    ACTIVITY  Increase activity slowly as tolerated, but follow the weight bearing instructions below.   No driving for 6 weeks or until further direction given by your physician.  You cannot drive while taking narcotics.  No lifting or carrying greater than 10 lbs. until further directed by your surgeon. Avoid periods of inactivity such as sitting longer than an hour when not asleep. This helps prevent blood clots.  You may return to work once you are authorized by your doctor.     WEIGHT BEARING   Weight bearing as tolerated with assist device (walker, cane,  etc) as directed, use it as long as suggested by your surgeon or therapist, typically at least 4-6 weeks.   EXERCISES  Results after joint replacement surgery are often greatly improved when you follow the exercise, range of motion and muscle strengthening exercises prescribed by your doctor. Safety measures are also important to protect the joint from further injury. Any time any of these exercises cause you to have increased pain or swelling, decrease what you are doing until you are comfortable again and then slowly increase them. If you have problems or questions, call your caregiver or physical therapist for advice.   Rehabilitation is important following a joint replacement. After just a few days of immobilization, the muscles of the leg can become weakened and shrink (atrophy).  These exercises are designed to build up the tone and strength of the thigh and leg muscles and to improve motion. Often times heat used for twenty to thirty minutes before working out will loosen up your tissues and help with improving the range of motion but do not use heat for the first two weeks following surgery (sometimes heat can increase post-operative swelling).   These exercises can be done on a training (exercise) mat, on the floor, on a table or on a bed. Use whatever works the best and is most comfortable for you.    Use music or television while you are exercising so that the exercises are a pleasant break in your  day. This will make your life better with the exercises acting as a break in your routine that you can look forward to.   Perform all exercises about fifteen times, three times per day or as directed.  You should exercise both the operative leg and the other leg as well.  Exercises include:   Quad Sets - Tighten up the muscle on the front of the thigh (Quad) and hold for 5-10 seconds.   Straight Leg Raises - With your knee straight (if you were given a brace, keep it on), lift the leg to 60  degrees, hold for 3 seconds, and slowly lower the leg.  Perform this exercise against resistance later as your leg gets stronger.  Leg Slides: Lying on your back, slowly slide your foot toward your buttocks, bending your knee up off the floor (only go as far as is comfortable). Then slowly slide your foot back down until your leg is flat on the floor again.  Angel Wings: Lying on your back spread your legs to the side as far apart as you can without causing discomfort.  Hamstring Strength:  Lying on your back, push your heel against the floor with your leg straight by tightening up the muscles of your buttocks.  Repeat, but this time bend your knee to a comfortable angle, and push your heel against the floor.  You may put a pillow under the heel to make it more comfortable if necessary.   A rehabilitation program following joint replacement surgery can speed recovery and prevent re-injury in the future due to weakened muscles. Contact your doctor or a physical therapist for more information on knee rehabilitation.    CONSTIPATION  Constipation is defined medically as fewer than three stools per week and severe constipation as less than one stool per week.  Even if you have a regular bowel pattern at home, your normal regimen is likely to be disrupted due to multiple reasons following surgery.  Combination of anesthesia, postoperative narcotics, change in appetite and fluid intake all can affect your bowels.   YOU MUST use at least one of the following options; they are listed in order of increasing strength to get the job done.  They are all available over the counter, and you may need to use some, POSSIBLY even all of these options:    Drink plenty of fluids (prune juice may be helpful) and high fiber foods Colace 100 mg by mouth twice a day  Senokot for constipation as directed and as needed Dulcolax (bisacodyl), take with full glass of water  Miralax (polyethylene glycol) once or twice a day as  needed.  If you have tried all these things and are unable to have a bowel movement in the first 3-4 days after surgery call either your surgeon or your primary doctor.    If you experience loose stools or diarrhea, hold the medications until you stool forms back up.  If your symptoms do not get better within 1 week or if they get worse, check with your doctor.  If you experience "the worst abdominal pain ever" or develop nausea or vomiting, please contact the office immediately for further recommendations for treatment.   ITCHING:  If you experience itching with your medications, try taking only a single pain pill, or even half a pain pill at a time.  You can also use Benadryl over the counter for itching or also to help with sleep.   TED HOSE STOCKINGS:  Use stockings on both  legs until for at least 2 weeks or as directed by physician office. They may be removed at night for sleeping.  MEDICATIONS:  See your medication summary on the "After Visit Summary" that nursing will review with you.  You may have some home medications which will be placed on hold until you complete the course of blood thinner medication.  It is important for you to complete the blood thinner medication as prescribed.   Blood clot prevention (DVT Prophylaxis): After surgery you are at an increased risk for a blood clot. you were prescribed a blood thinner, Aspirin 81mg , to be taken twice daily for a total of 4 weeks from surgery to help reduce your risk of getting a blood clot. This will help prevent a blood clot. Signs of a pulmonary embolus (blood clot in the lungs) include sudden short of breath, feeling lightheaded or dizzy, chest pain with a deep breath, rapid pulse rapid breathing. Signs of a blood clot in your arms or legs include new unexplained swelling and cramping, warm, red or darkened skin around the painful area. Please call the office or 911 right away if these signs or symptoms develop.  PRECAUTIONS:  If you  experience chest pain or shortness of breath - call 911 immediately for transfer to the hospital emergency department.   If you develop a fever greater that 101 F, purulent drainage from wound, increased redness or drainage from wound, foul odor from the wound/dressing, or calf pain - CONTACT YOUR SURGEON.                                                   FOLLOW-UP APPOINTMENTS:  If you do not already have a post-op appointment, please call the office for an appointment to be seen by your surgeon.  Guidelines for how soon to be seen are listed in your "After Visit Summary", but are typically between 2-3 weeks after surgery.  OTHER INSTRUCTIONS:    POST-OPERATIVE OPIOID TAPER INSTRUCTIONS: It is important to wean off of your opioid medication as soon as possible. If you do not need pain medication after your surgery it is ok to stop day one. Opioids include: Codeine, Hydrocodone(Norco, Vicodin), Oxycodone(Percocet, oxycontin) and hydromorphone amongst others.  Long term and even short term use of opiods can cause: Increased pain response Dependence Constipation Depression Respiratory depression And more.  Withdrawal symptoms can include Flu like symptoms Nausea, vomiting And more Techniques to manage these symptoms Hydrate well Eat regular healthy meals Stay active Use relaxation techniques(deep breathing, meditating, yoga) Do Not substitute Alcohol to help with tapering If you have been on opioids for less than two weeks and do not have pain than it is ok to stop all together.  Plan to wean off of opioids This plan should start within one week post op of your joint replacement. Maintain the same interval or time between taking each dose and first decrease the dose.  Cut the total daily intake of opioids by one tablet each day Next start to increase the time between doses. The last dose that should be eliminated is the evening dose.   MAKE SURE YOU:  Understand these  instructions.  Get help right away if you are not doing well or get worse.    Thank you for letting us be a part of your medical care team.  It is a privilege we respect greatly.  We hope these instructions will help you stay on track for a fast and full recovery!         

## 2022-05-18 NOTE — Anesthesia Postprocedure Evaluation (Signed)
Anesthesia Post Note  Patient: Mary Mcdowell  Procedure(s) Performed: TOTAL HIP ARTHROPLASTY (Right: Hip)     Patient location during evaluation: PACU Anesthesia Type: Spinal Level of consciousness: oriented and awake and alert Pain management: pain level controlled Vital Signs Assessment: post-procedure vital signs reviewed and stable Respiratory status: spontaneous breathing, respiratory function stable and nonlabored ventilation Cardiovascular status: blood pressure returned to baseline and stable Postop Assessment: no headache, no backache, no apparent nausea or vomiting and spinal receding Anesthetic complications: no   No notable events documented.  Last Vitals:  Vitals:   05/18/22 1600 05/18/22 1615  BP: 110/65 112/85  Pulse: (!) 59 60  Resp: 10 14  Temp:    SpO2: 100% 100%    Last Pain:  Vitals:   05/18/22 1615  TempSrc:   PainSc: 0-No pain                 Fizza Scales A.

## 2022-05-18 NOTE — Anesthesia Preprocedure Evaluation (Addendum)
Anesthesia Evaluation  Patient identified by MRN, date of birth, ID band Patient awake    Reviewed: Allergy & Precautions, NPO status , Patient's Chart, lab work & pertinent test results  Airway Mallampati: II  TM Distance: >3 FB Neck ROM: Full    Dental  (+) Missing   Pulmonary former smoker   Pulmonary exam normal        Cardiovascular hypertension, Pt. on medications Normal cardiovascular exam     Neuro/Psych  PSYCHIATRIC DISORDERS  Depression     Neuromuscular disease    GI/Hepatic negative GI ROS, Neg liver ROS,,,  Endo/Other  negative endocrine ROS    Renal/GU negative Renal ROS     Musculoskeletal  (+) Arthritis ,    Abdominal   Peds  Hematology negative hematology ROS (+)   Anesthesia Other Findings OA RIGHT HIP  Reproductive/Obstetrics                              Anesthesia Physical Anesthesia Plan  ASA: 2  Anesthesia Plan: Spinal   Post-op Pain Management:    Induction: Intravenous  PONV Risk Score and Plan: 2 and Ondansetron, Dexamethasone, Propofol infusion and Treatment may vary due to age or medical condition  Airway Management Planned: Simple Face Mask  Additional Equipment:   Intra-op Plan:   Post-operative Plan:   Informed Consent: I have reviewed the patients History and Physical, chart, labs and discussed the procedure including the risks, benefits and alternatives for the proposed anesthesia with the patient or authorized representative who has indicated his/her understanding and acceptance.     Dental advisory given  Plan Discussed with: CRNA  Anesthesia Plan Comments:          Anesthesia Quick Evaluation

## 2022-05-19 DIAGNOSIS — M1611 Unilateral primary osteoarthritis, right hip: Secondary | ICD-10-CM | POA: Diagnosis not present

## 2022-05-19 DIAGNOSIS — Z79899 Other long term (current) drug therapy: Secondary | ICD-10-CM | POA: Diagnosis not present

## 2022-05-19 DIAGNOSIS — I10 Essential (primary) hypertension: Secondary | ICD-10-CM | POA: Diagnosis not present

## 2022-05-19 DIAGNOSIS — Z87891 Personal history of nicotine dependence: Secondary | ICD-10-CM | POA: Diagnosis not present

## 2022-05-19 LAB — BASIC METABOLIC PANEL
Anion gap: 7 (ref 5–15)
BUN: 14 mg/dL (ref 8–23)
CO2: 23 mmol/L (ref 22–32)
Calcium: 8.4 mg/dL — ABNORMAL LOW (ref 8.9–10.3)
Chloride: 104 mmol/L (ref 98–111)
Creatinine, Ser: 0.55 mg/dL (ref 0.44–1.00)
GFR, Estimated: >60 mL/min (ref >60)
Glucose, Bld: 141 mg/dL — ABNORMAL HIGH (ref 70–99)
Potassium: 4 mmol/L (ref 3.5–5.1)
Sodium: 134 mmol/L — ABNORMAL LOW (ref 135–145)

## 2022-05-19 LAB — CBC
HCT: 35.2 % — ABNORMAL LOW (ref 36.0–46.0)
Hemoglobin: 11.5 g/dL — ABNORMAL LOW (ref 12.0–15.0)
MCH: 30.7 pg (ref 26.0–34.0)
MCHC: 32.7 g/dL (ref 30.0–36.0)
MCV: 93.9 fL (ref 80.0–100.0)
Platelets: 229 10*3/uL (ref 150–400)
RBC: 3.75 MIL/uL — ABNORMAL LOW (ref 3.87–5.11)
RDW: 13.7 % (ref 11.5–15.5)
WBC: 7.2 10*3/uL (ref 4.0–10.5)
nRBC: 0 % (ref 0.0–0.2)

## 2022-05-19 MED ORDER — ONDANSETRON HCL 4 MG PO TABS: 4.0000 mg | ORAL_TABLET | Freq: Three times a day (TID) | ORAL | 0 refills | Status: AC | PRN

## 2022-05-19 MED ORDER — ASPIRIN 81 MG PO TBEC: 81.0000 mg | DELAYED_RELEASE_TABLET | Freq: Two times a day (BID) | ORAL | 0 refills | Status: AC

## 2022-05-19 MED ORDER — OXYCODONE HCL 5 MG PO TABS: 2.5000 mg | ORAL_TABLET | ORAL | 0 refills | Status: AC | PRN

## 2022-05-19 MED ORDER — ALUM & MAG HYDROXIDE-SIMETH 200-200-20 MG/5ML PO SUSP
30.0000 mL | Freq: Four times a day (QID) | ORAL | Status: DC | PRN
  Administered 2022-05-19: 30 mL via ORAL

## 2022-05-19 MED ORDER — ACETAMINOPHEN 500 MG PO TABS: 1000.0000 mg | ORAL_TABLET | Freq: Three times a day (TID) | ORAL | 0 refills | Status: AC | PRN

## 2022-05-19 NOTE — Progress Notes (Signed)
   05/19/22 1300  PT Visit Information  Last PT Received On 05/19/22  Assistance Needed Pt fearful of getting OOB, d/t being "woozy" d/t her pain meds. Reviewed bed level exercises as pt wished to defer further activity. Pt tol HEP well, no incr pain  History of Present Illness 77 yo female s/p R posterior THA on 05/18/21. PMH: HTN, lumbar fusion, IBS  Precautions  Precautions Fall  Restrictions  Weight Bearing Restrictions No  RLE Weight Bearing WBAT  Pain Assessment  Pain Assessment 0-10  Pain Score 3  Pain Location right hip  Pain Descriptors / Indicators Sore  Pain Intervention(s) Limited activity within patient's tolerance;Monitored during session;Premedicated before session;Repositioned;Ice applied  Cognition  Arousal/Alertness Awake/alert  Behavior During Therapy WFL for tasks assessed/performed  Overall Cognitive Status Within Functional Limits for tasks assessed  Bed Mobility  General bed mobility comments  (pt  reports feeling "dizzy, woozy and sleepy" in bed, pt deferred OOB)  Transfers  Transfers Sit to/from Stand  Total Joint Exercises  Ankle Circles/Pumps AROM;Both;10 reps  Quad Sets AROM;Both;10 reps  Gluteal Sets AROM;Both;10 reps  Heel Slides AAROM;Right;10 reps  Hip ABduction/ADduction AAROM;Right;PROM;10 reps  PT - End of Session  Equipment Utilized During Treatment Gait belt  Activity Tolerance Patient tolerated treatment well  Patient left with call bell/phone within reach;in bed;with bed alarm set;with family/visitor present   PT - Assessment/Plan  PT Plan Current plan remains appropriate  PT Visit Diagnosis Other abnormalities of gait and mobility (R26.89);Difficulty in walking, not elsewhere classified (R26.2)  PT Frequency (ACUTE ONLY) 7X/week  Follow Up Recommendations Follow physician's recommendations for discharge plan and follow up therapies  Assistance recommended at discharge Frequent or constant Supervision/Assistance  Patient can return home  with the following A little help with walking and/or transfers;A little help with bathing/dressing/bathroom;Assist for transportation;Help with stairs or ramp for entrance;Assistance with cooking/housework  PT equipment Rolling walker (2 wheels)  AM-PAC PT "6 Clicks" Mobility Outcome Measure (Version 2)  Help needed turning from your back to your side while in a flat bed without using bedrails? 3  Help needed moving from lying on your back to sitting on the side of a flat bed without using bedrails? 3  Help needed moving to and from a bed to a chair (including a wheelchair)? 3  Help needed standing up from a chair using your arms (e.g., wheelchair or bedside chair)? 3  Help needed to walk in hospital room? 3  Help needed climbing 3-5 steps with a railing?  3  6 Click Score 18  Consider Recommendation of Discharge To: Home with [facility]  PT Goal Progression  Progress towards PT goals Progressing toward goals  Acute Rehab PT Goals  PT Goal Formulation With patient  Time For Goal Achievement 05/26/22  Potential to Achieve Goals Good  PT Time Calculation  PT Start Time (ACUTE ONLY) 1330  PT Stop Time (ACUTE ONLY) 1348  PT Time Calculation (min) (ACUTE ONLY) 18 min  PT General Charges  $$ ACUTE PT VISIT 1 Visit  PT Treatments  $Gait Training 8-22 mins

## 2022-05-19 NOTE — Evaluation (Signed)
Physical Therapy Evaluation Patient Details Name: Mary Mcdowell MRN: [id number] DOB: [birth year] Today's Date: 05/19/2022  History of Present Illness  77 yo female s/p R posterior THA on 05/18/21. PMH: HTN, lumbar fusion, IBS  Clinical Impression  Pt admitted with above diagnosis.  Pt amb ~ 45' + 15 with RW and min assist to min/guard, tol well with pain only increasing on return to bed. Will continue PT in pm and pt is hopeful to d/c later today   Pt currently with functional limitations due to the deficits listed below (see PT Problem List). Pt will benefit from skilled PT to increase their independence and safety with mobility to allow discharge to the venue listed below.          Recommendations for follow up therapy are one component of a multi-disciplinary discharge planning process, led by the attending physician.  Recommendations may be updated based on patient status, additional functional criteria and insurance authorization.  Follow Up Recommendations Follow physician's recommendations for discharge plan and follow up therapies (HHPT)      Assistance Recommended at Discharge Frequent or constant Supervision/Assistance  Patient can return home with the following  A little help with walking and/or transfers;A little help with bathing/dressing/bathroom;Assist for transportation;Help with stairs or ramp for entrance;Assistance with cooking/housework    Equipment Recommendations Rolling walker (2 wheels) (youth)  Recommendations for Other Services       Functional Status Assessment Patient has had a recent decline in their functional status and demonstrates the ability to make significant improvements in function in a reasonable and predictable amount of time.     Precautions / Restrictions Precautions Precautions: Fall Restrictions Weight Bearing Restrictions: No RLE Weight Bearing: Weight bearing as tolerated      Mobility  Bed Mobility Overal bed mobility: Needs  Assistance Bed Mobility: Sit to Supine       Sit to supine: Min assist   General bed mobility comments: assist with LLE, cues for technique and self assist    Transfers Overall transfer level: Needs assistance Equipment used: Rolling walker (2 wheels) Transfers: Sit to/from Stand Sit to Stand: Min guard           General transfer comment: cues for hand placement and RLE position STS from 3in1 and chair; min/guard for safety    Ambulation/Gait Ambulation/Gait assistance: Min guard Gait Distance (Feet): 45 Feet (15' more) Assistive device: Rolling walker (2 wheels) Gait Pattern/deviations: Step-to pattern, Trunk flexed, Narrow base of support       General Gait Details: cues for RW proximity to self and sequence  Stairs            Wheelchair Mobility    Modified Rankin (Stroke Patients Only)       Balance Overall balance assessment: Mild deficits observed, not formally tested                                           Pertinent Vitals/Pain Pain Assessment Pain Assessment: 0-10 Pain Score: 4  Pain Location: right hip Pain Descriptors / Indicators: Squeezing, Discomfort Pain Intervention(s): Limited activity within patient's tolerance, Monitored during session, Premedicated before session, Repositioned    Home Living Family/patient expects to be discharged to:: Private residence Living Arrangements: Children Available Help at Discharge: Family;Available 24 hours/day Type of Home: House Home Access: Stairs to enter Entrance Stairs-Rails: Right Entrance Stairs-Number of Steps: 2  Home Layout: One level Home Equipment: Rollator (4 wheels) Additional Comments: family assisting    Prior Function Prior Level of Function : Independent/Modified Independent                     Hand Dominance        Extremity/Trunk Assessment   Upper Extremity Assessment Upper Extremity Assessment: Overall WFL for tasks assessed     Lower Extremity Assessment Lower Extremity Assessment: RLE deficits/detail RLE Deficits / Details: ankle WFL, knee and hip grossly 2+/5       Communication   Communication: No difficulties  Cognition Arousal/Alertness: Awake/alert Behavior During Therapy: WFL for tasks assessed/performed Overall Cognitive Status: Within Functional Limits for tasks assessed                                          General Comments      Exercises     Assessment/Plan    PT Assessment Patient needs continued PT services  PT Problem List Decreased strength;Decreased range of motion;Decreased activity tolerance;Decreased mobility;Pain;Decreased knowledge of precautions;Decreased balance;Decreased safety awareness;Decreased knowledge of use of DME       PT Treatment Interventions DME instruction;Therapeutic exercise;Gait training;Functional mobility training;Therapeutic activities;Patient/family education;Stair training    PT Goals (Current goals can be found in the Care Plan section)  Acute Rehab PT Goals PT Goal Formulation: With patient Time For Goal Achievement: 05/26/22 Potential to Achieve Goals: Good    Frequency 7X/week     Co-evaluation               AM-PAC PT "6 Clicks" Mobility  Outcome Measure Help needed turning from your back to your side while in a flat bed without using bedrails?: A Little Help needed moving from lying on your back to sitting on the side of a flat bed without using bedrails?: A Little Help needed moving to and from a bed to a chair (including a wheelchair)?: A Little Help needed standing up from a chair using your arms (e.g., wheelchair or bedside chair)?: A Little Help needed to walk in hospital room?: A Little Help needed climbing 3-5 steps with a railing? : A Little 6 Click Score: 18    End of Session Equipment Utilized During Treatment: Gait belt Activity Tolerance: Patient tolerated treatment well Patient left: with call  bell/phone within reach;in bed;with bed alarm set;with family/visitor present   PT Visit Diagnosis: Other abnormalities of gait and mobility (R26.89);Difficulty in walking, not elsewhere classified (R26.2)    Time: 7672-0947 PT Time Calculation (min) (ACUTE ONLY): 25 min   Charges:   PT Evaluation $PT Eval Low Complexity: 1 Low PT Treatments $Gait Training: 8-22 mins        Baxter Flattery, PT  Acute Rehab Dept [facility]) [phone]  [facility] Weekend Pager [facility] only)  [phone]  05/19/2022   [facility] 05/19/2022, 11:49 AM

## 2022-05-19 NOTE — Progress Notes (Signed)
     Subjective:  Patient reports pain as mild.  Eager to work with PT today. No concerns. Possible dc home later today if clears PT. Has only gotten tylenol for pain.  Objective:   VITALS:   Vitals:   05/18/22 1800 05/18/22 1952 05/19/22 0246 05/19/22 0628  BP: 107/70 (!) 122/54 (!) 116/59 (!) 110/55  Pulse: 83 88 88 80  Resp: 16 18 16 18   Temp:  97.6 F (36.4 C) 98.3 F (36.8 C) 97.8 F (36.6 C)  TempSrc:  Oral Oral Oral  SpO2: 95% 94% 97% 95%  Weight:      Height:        Sensation intact distally Intact pulses distally Dorsiflexion/Plantar flexion intact Incision: dressing C/D/I Compartment soft    Lab Results  Component Value Date   WBC 7.2 05/19/2022   HGB 11.5 (L) 05/19/2022   HCT 35.2 (L) 05/19/2022   MCV 93.9 05/19/2022   PLT 229 05/19/2022   BMET    Component Value Date/Time   NA 134 (L) 05/19/2022 0433   K 4.0 05/19/2022 0433   CL 104 05/19/2022 0433   CO2 23 05/19/2022 0433   GLUCOSE 141 (H) 05/19/2022 0433   BUN 14 05/19/2022 0433   CREATININE 0.55 05/19/2022 0433   CALCIUM 8.4 (L) 05/19/2022 0433   GFRNONAA >60 05/19/2022 0433      Xray: THA components in good position no adverse features.  Assessment/Plan: 1 Day Post-Op   Principal Problem:   Osteoarthritis of right hip, unspecified osteoarthritis type  S/p R THA 05/18/22   Post op recs: WB: WBAT RLE, No formal hip precautions Abx: ancef Imaging: PACU pelvis Xray Dressing: Aquacell, keep intact until follow up DVT prophylaxis: Aspirin 81BID starting POD1 Follow up: 2 weeks after surgery for a wound check with Dr. Zachery Dakins at [facility].  Address: [address] [place], [place], [place] [address]  Office Phone: [phone]     Charlies Constable, MD Orthopedic Surgeon    Mikalia Fessel A Willard 05/19/2022, 6:51 AM   Charlies Constable, MD  Contact information:   [phone] 7am-5pm epic message Dr. Zachery Dakins, or call office for patient follow up: [phone]  [phone] After hours and holidays please check [email or web address] for group call information for [facility]

## 2022-05-19 NOTE — TOC Transition Note (Addendum)
Transition of Care [facility]) - CM/SW Discharge Note   Patient Details  Name: [name] MRN: [id number] Date of Birth: 09/16/1945  [facility] CM/SW Contact:  Lennart Pall, LCSW Phone Number: 05/19/2022, 11:45 AM   Clinical Narrative:     Met with pt and daughter today and confirming need for RW and BSC with no DME agency preference.  Order placed with [facility] for deliver to room.  HHPT prearranged with [facility] via MD office.  Info placed on AVS.  No further TOC needs.  Final next level of care: [place] Barriers to Discharge: No Barriers Identified   Patient Goals and CMS Choice      Discharge Placement                         Discharge Plan and Services Additional resources added to the After Visit Summary for                  DME Arranged: [facility] [employer] DME Agency: [employer] Date DME Agency Contacted: 05/19/22 Time DME Agency Contacted: 1122 Representative spoke with at DME Agency: Erasmo Downer [place]: PT [place]: [place]        Social Determinants of Health ([place]) Interventions SDOH Screenings   Tobacco Use: Medium Risk (05/18/2022)     Readmission Risk Interventions     No data to display

## 2022-05-20 DIAGNOSIS — Z79899 Other long term (current) drug therapy: Secondary | ICD-10-CM | POA: Diagnosis not present

## 2022-05-20 DIAGNOSIS — Z87891 Personal history of nicotine dependence: Secondary | ICD-10-CM | POA: Diagnosis not present

## 2022-05-20 DIAGNOSIS — I10 Essential (primary) hypertension: Secondary | ICD-10-CM | POA: Diagnosis not present

## 2022-05-20 DIAGNOSIS — M1611 Unilateral primary osteoarthritis, right hip: Secondary | ICD-10-CM | POA: Diagnosis not present

## 2022-05-20 LAB — CBC
HCT: 29.3 % — ABNORMAL LOW (ref 36.0–46.0)
Hemoglobin: 9.6 g/dL — ABNORMAL LOW (ref 12.0–15.0)
MCH: 30.6 pg (ref 26.0–34.0)
MCHC: 32.8 g/dL (ref 30.0–36.0)
MCV: 93.3 fL (ref 80.0–100.0)
Platelets: 180 10*3/uL (ref 150–400)
RBC: 3.14 MIL/uL — ABNORMAL LOW (ref 3.87–5.11)
RDW: 14 % (ref 11.5–15.5)
WBC: 8 10*3/uL (ref 4.0–10.5)
nRBC: 0 % (ref 0.0–0.2)

## 2022-05-20 MED ORDER — OXYCODONE HCL 5 MG PO TABS: 2.5000 mg | ORAL_TABLET | ORAL | Status: DC | PRN

## 2022-05-20 NOTE — Discharge Summary (Signed)
Physician Discharge Summary  Patient ID: Mary Mcdowell MRN: [id number] DOB/AGE: 10-09-1945 77 y.o.  Admit date: 05/18/2022 Discharge date: 05/20/2022  Admission Diagnoses:  Osteoarthritis of right hip, unspecified osteoarthritis type  Discharge Diagnoses:  Principal Problem:   Osteoarthritis of right hip, unspecified osteoarthritis type   Past Medical History:  Diagnosis Date   Arthritis    Depression    Hypertension    Pre-diabetes    Seborrheic dermatitis     Surgeries: Procedure(s): TOTAL HIP ARTHROPLASTY on 05/18/2022   Consultants (if any):   Discharged Condition: Improved  Hospital Course: Mary Mcdowell is an 77 y.o. female who was admitted 05/18/2022 with a diagnosis of Osteoarthritis of right hip, unspecified osteoarthritis type and went to the operating room on 05/18/2022 and underwent the above named procedures.    She was given perioperative antibiotics:  Anti-infectives (From admission, onward)    Start     Dose/Rate Route Frequency Ordered Stop   05/18/22 2000  ceFAZolin (ANCEF) IVPB 2g/100 mL premix        2 g 200 mL/hr over 30 Minutes Intravenous Every 6 hours 05/18/22 1545 05/19/22 1213   05/18/22 1030  ceFAZolin (ANCEF) IVPB 2g/100 mL premix        2 g 200 mL/hr over 30 Minutes Intravenous On call to O.R. 05/18/22 1019 05/18/22 1349     .  She was given sequential compression devices, early ambulation, and aspirin for DVT prophylaxis.  She benefited maximally from the hospital stay and there were no complications.    Recent vital signs:  Vitals:   05/19/22 2031 05/20/22 0453  BP: (!) 113/54 (!) 91/52  Pulse: 90 91  Resp:  16  Temp:  99.1 F (37.3 C)  SpO2:  93%    Recent laboratory studies:  Lab Results  Component Value Date   HGB 9.6 (L) 05/20/2022   HGB 11.5 (L) 05/19/2022   HGB 14.4 05/07/2022   Lab Results  Component Value Date   WBC 8.0 05/20/2022   PLT 180 05/20/2022   No results found for: "INR" Lab Results  Component  Value Date   NA 134 (L) 05/19/2022   K 4.0 05/19/2022   CL 104 05/19/2022   CO2 23 05/19/2022   BUN 14 05/19/2022   CREATININE 0.55 05/19/2022   GLUCOSE 141 (H) 05/19/2022    Discharge Medications:   Allergies as of 05/20/2022       Reactions   Codeine    REACTION: nausea/vomiting   Ibuprofen Other (See Comments)   Bowel bleed   Nsaids Other (See Comments)   GI BLEEDING        Medication List     TAKE these medications    acetaminophen 500 MG tablet Commonly known as: TYLENOL Take 2 tablets (1,000 mg total) by mouth every 8 (eight) hours as needed.   amLODipine 5 MG tablet Commonly known as: NORVASC Take 5 mg by mouth daily.   aspirin EC 81 MG tablet Take 1 tablet (81 mg total) by mouth 2 (two) times daily for 28 days. Swallow whole.   cetirizine 10 MG tablet Commonly known as: ZYRTEC Take 10 mg by mouth daily as needed for allergies.   clobetasol cream 0.05 % Commonly known as: TEMOVATE Apply 1 Application topically 2 (two) times a week.   conjugated estrogens vaginal cream Commonly known as: PREMARIN Place 1 applicator vaginally daily as needed (For dryness).   cyanocobalamin 1000 MCG tablet Commonly known as: VITAMIN B12 Take 1,000 mcg by  mouth daily with breakfast.   docusate sodium 100 MG capsule Commonly known as: COLACE Take 100 mg by mouth daily as needed for mild constipation.   fluticasone 50 MCG/ACT nasal spray Commonly known as: FLONASE Place 1 spray into both nostrils 2 (two) times daily as needed for allergies or rhinitis.   hydrocortisone 2.5 % rectal cream Commonly known as: ANUSOL-HC Place 1 Application rectally 2 (two) times daily as needed for hemorrhoids or anal itching.   KRILL OIL PO Take 500 mg by mouth daily.   lidocaine 5 % Commonly known as: Lidoderm Place 1 patch onto the skin daily. Remove & Discard patch within 12 hours or as directed by MD   meclizine 25 MG tablet Commonly known as: ANTIVERT Take 25 mg by mouth  2 (two) times daily as needed for dizziness.   meloxicam 7.5 MG tablet Commonly known as: MOBIC Take 7.5 mg by mouth daily.   multivitamin with minerals tablet Take 1 tablet by mouth daily.   HAIR SKIN & NAILS PO Take 1,200 mg by mouth daily with breakfast. 600 mg gummy   ondansetron 4 MG tablet Commonly known as: Zofran Take 1 tablet (4 mg total) by mouth every 8 (eight) hours as needed for up to 14 days for nausea or vomiting.   oxyCODONE 5 MG immediate release tablet Commonly known as: Roxicodone Take 0.5-1 tablets (2.5-5 mg total) by mouth every 4 (four) hours as needed for up to 7 days for severe pain or moderate pain.   pregabalin 150 MG capsule Commonly known as: LYRICA Take 150 mg by mouth at bedtime.   Prolia 60 MG/ML Sosy injection Generic drug: denosumab Inject 60 mg into the skin every 6 (six) months.   psyllium 58.6 % packet Commonly known as: METAMUCIL Take 1 packet by mouth daily.   Systane Ultra 0.4-0.3 % Soln Generic drug: Polyethyl Glycol-Propyl Glycol Place 1 drop into both eyes 2 (two) times daily.   tiZANidine 4 MG tablet Commonly known as: ZANAFLEX Take 4 mg by mouth 2 (two) times daily as needed for muscle spasms.   venlafaxine XR 75 MG 24 hr capsule Commonly known as: EFFEXOR-XR Take 75 mg by mouth daily with breakfast.   Vitamin K2 100 MCG Caps Take 100 mcg by mouth at bedtime. With meal               Durable Medical Equipment  (From admission, onward)           Start     Ordered   05/19/22 1118  For home use only DME Walker rolling  Once       Question Answer Comment  Walker: With [place] Wheels   Patient needs a walker to treat with the following condition Increased weakness when ambulating      05/19/22 1117   05/19/22 1118  For home use only DME Bedside commode  Once       Question:  Patient needs a bedside commode to treat with the following condition  Answer:  Weakness   05/19/22 1117            Diagnostic  Studies: DG HIP UNILAT W OR W/O PELVIS 2-3 VIEWS RIGHT  Result Date: 05/18/2022 CLINICAL DATA:  Post RIGHT hip replacement EXAM: DG HIP (WITH OR WITHOUT PELVIS) 2-3V RIGHT COMPARISON:  Portable exam 1611 hours compared to intraoperative image of 05/18/2022 FINDINGS: RIGHT hip prosthesis identified. Diffuse osseous demineralization. No fracture, dislocation, or bone destruction. Facet degenerative changes lower lumbar spine.  LEFT hip and SI joint spaces preserved. Ice pack projects over RIGHT hip region. IMPRESSION: RIGHT hip prosthesis without acute complication. Electronically Signed   By: Ulyses Southward M.D.   On: 05/18/2022 16:19   DG Pelvis Portable  Result Date: 05/18/2022 CLINICAL DATA:  Elective surgery EXAM: PORTABLE PELVIS 1 VIEWS COMPARISON:  None Available. FINDINGS: Intraoperative x-ray demonstrates right hip arthroplasty with a screw fixated acetabular cup and Press-Fit femoral component. Lapping soft tissue gas and open wound lateral. Imaging was obtained to aid in treatment. Please correlate with surgical history. IMPRESSION: Intraoperative x-ray Electronically Signed   By: Karen Kays M.D.   On: 05/18/2022 15:18    Disposition: Discharge disposition: 01-Home or Self Care       Discharge Instructions     Call MD / Call 911   Complete by: As directed    If you experience chest pain or shortness of breath, CALL 911 and be transported to the hospital emergency room.  If you develope a fever above 101 F, pus (white drainage) or increased drainage or redness at the wound, or calf pain, call your surgeon's office.   Constipation Prevention   Complete by: As directed    Drink plenty of fluids.  Prune juice may be helpful.  You may use a stool softener, such as Colace (over the counter) 100 mg twice a day.  Use MiraLax (over the counter) for constipation as needed.   Diet - low sodium heart healthy   Complete by: As directed    Increase activity slowly as tolerated   Complete by: As  directed    Post-operative opioid taper instructions:   Complete by: As directed    POST-OPERATIVE OPIOID TAPER INSTRUCTIONS: It is important to wean off of your opioid medication as soon as possible. If you do not need pain medication after your surgery it is ok to stop day one. Opioids include: Codeine, Hydrocodone(Norco, Vicodin), Oxycodone(Percocet, oxycontin) and hydromorphone amongst others.  Long term and even short term use of opiods can cause: Increased pain response Dependence Constipation Depression Respiratory depression And more.  Withdrawal symptoms can include Flu like symptoms Nausea, vomiting And more Techniques to manage these symptoms Hydrate well Eat regular healthy meals Stay active Use relaxation techniques(deep breathing, meditating, yoga) Do Not substitute Alcohol to help with tapering If you have been on opioids for less than two weeks and do not have pain than it is ok to stop all together.  Plan to wean off of opioids This plan should start within one week post op of your joint replacement. Maintain the same interval or time between taking each dose and first decrease the dose.  Cut the total daily intake of opioids by one tablet each day Next start to increase the time between doses. The last dose that should be eliminated is the evening dose.           Follow-up Information     [facility] Follow up.   Specialty: Home Health Services Why: to provide home physical therpay visits Contact information: [address] [place] [address] [phone]                    Discharge Instructions      INSTRUCTIONS AFTER JOINT REPLACEMENT   Remove items at home which could result in a fall. This includes throw rugs or furniture in walking pathways ICE to the affected joint every three hours while awake for 30 minutes at a  time, for at least the first 3-5 days, and then as needed for pain and swelling.  Continue  to use ice for pain and swelling. You may notice swelling that will progress down to the foot and ankle.  This is normal after surgery.  Elevate your leg when you are not up walking on it.   Continue to use the breathing machine you got in the hospital (incentive spirometer) which will help keep your temperature down.  It is common for your temperature to cycle up and down following surgery, especially at night when you are not up moving around and exerting yourself.  The breathing machine keeps your lungs expanded and your temperature down.   DIET:  As you were doing prior to hospitalization, we recommend a well-balanced diet.  DRESSING / WOUND CARE / SHOWERING  Keep the surgical dressing until follow up.  The dressing is water proof, so you can shower without any extra covering.  IF THE DRESSING FALLS OFF or the wound gets wet inside, change the dressing with sterile gauze.  Please use good hand washing techniques before changing the dressing.  Do not use any lotions or creams on the incision until instructed by your surgeon.    ACTIVITY  Increase activity slowly as tolerated, but follow the weight bearing instructions below.   No driving for 6 weeks or until further direction given by your physician.  You cannot drive while taking narcotics.  No lifting or carrying greater than 10 lbs. until further directed by your surgeon. Avoid periods of inactivity such as sitting longer than an hour when not asleep. This helps prevent blood clots.  You may return to work once you are authorized by your doctor.     WEIGHT BEARING   Weight bearing as tolerated with assist device (walker, cane, etc) as directed, use it as long as suggested by your surgeon or therapist, typically at least 4-6 weeks.   EXERCISES  Results after joint replacement surgery are often greatly improved when you follow the exercise, range of motion and muscle strengthening exercises prescribed by your doctor. Safety measures  are also important to protect the joint from further injury. Any time any of these exercises cause you to have increased pain or swelling, decrease what you are doing until you are comfortable again and then slowly increase them. If you have problems or questions, call your caregiver or physical therapist for advice.   Rehabilitation is important following a joint replacement. After just a few days of immobilization, the muscles of the leg can become weakened and shrink (atrophy).  These exercises are designed to build up the tone and strength of the thigh and leg muscles and to improve motion. Often times heat used for twenty to thirty minutes before working out will loosen up your tissues and help with improving the range of motion but do not use heat for the first two weeks following surgery (sometimes heat can increase post-operative swelling).   These exercises can be done on a training (exercise) mat, on the floor, on a table or on a bed. Use whatever works the best and is most comfortable for you.    Use music or television while you are exercising so that the exercises are a pleasant break in your day. This will make your life better with the exercises acting as a break in your routine that you can look forward to.   Perform all exercises about fifteen times, three times per day or as directed.  You should exercise both the operative leg and the other leg as well.  Exercises include:   Quad Sets - Tighten up the muscle on the front of the thigh (Quad) and hold for 5-10 seconds.   Straight Leg Raises - With your knee straight (if you were given a brace, keep it on), lift the leg to 60 degrees, hold for 3 seconds, and slowly lower the leg.  Perform this exercise against resistance later as your leg gets stronger.  Leg Slides: Lying on your back, slowly slide your foot toward your buttocks, bending your knee up off the floor (only go as far as is comfortable). Then slowly slide your foot back down  until your leg is flat on the floor again.  Angel Wings: Lying on your back spread your legs to the side as far apart as you can without causing discomfort.  Hamstring Strength:  Lying on your back, push your heel against the floor with your leg straight by tightening up the muscles of your buttocks.  Repeat, but this time bend your knee to a comfortable angle, and push your heel against the floor.  You may put a pillow under the heel to make it more comfortable if necessary.   A rehabilitation program following joint replacement surgery can speed recovery and prevent re-injury in the future due to weakened muscles. Contact your doctor or a physical therapist for more information on knee rehabilitation.    CONSTIPATION  Constipation is defined medically as fewer than three stools per week and severe constipation as less than one stool per week.  Even if you have a regular bowel pattern at home, your normal regimen is likely to be disrupted due to multiple reasons following surgery.  Combination of anesthesia, postoperative narcotics, change in appetite and fluid intake all can affect your bowels.   YOU MUST use at least one of the following options; they are listed in order of increasing strength to get the job done.  They are all available over the counter, and you may need to use some, POSSIBLY even all of these options:    Drink plenty of fluids (prune juice may be helpful) and high fiber foods Colace 100 mg by mouth twice a day  Senokot for constipation as directed and as needed Dulcolax (bisacodyl), take with full glass of water  Miralax (polyethylene glycol) once or twice a day as needed.  If you have tried all these things and are unable to have a bowel movement in the first 3-4 days after surgery call either your surgeon or your primary doctor.    If you experience loose stools or diarrhea, hold the medications until you stool forms back up.  If your symptoms do not get better within 1  week or if they get worse, check with your doctor.  If you experience "the worst abdominal pain ever" or develop nausea or vomiting, please contact the office immediately for further recommendations for treatment.   ITCHING:  If you experience itching with your medications, try taking only a single pain pill, or even half a pain pill at a time.  You can also use Benadryl over the counter for itching or also to help with sleep.   TED HOSE STOCKINGS:  Use stockings on both legs until for at least 2 weeks or as directed by physician office. They may be removed at night for sleeping.  MEDICATIONS:  See your medication summary on the "After Visit Summary" that nursing will review with you.  You may have some home medications which will be placed on hold until you complete the course of blood thinner medication.  It is important for you to complete the blood thinner medication as prescribed.   Blood clot prevention (DVT Prophylaxis): After surgery you are at an increased risk for a blood clot. you were prescribed a blood thinner, Aspirin 81mg , to be taken twice daily for a total of 4 weeks from surgery to help reduce your risk of getting a blood clot. This will help prevent a blood clot. Signs of a pulmonary embolus (blood clot in the lungs) include sudden short of breath, feeling lightheaded or dizzy, chest pain with a deep breath, rapid pulse rapid breathing. Signs of a blood clot in your arms or legs include new unexplained swelling and cramping, warm, red or darkened skin around the painful area. Please call the office or 911 right away if these signs or symptoms develop.  PRECAUTIONS:  If you experience chest pain or shortness of breath - call 911 immediately for transfer to the hospital emergency department.   If you develop a fever greater that 101 F, purulent drainage from wound, increased redness or drainage from wound, foul odor from the wound/dressing, or calf pain - CONTACT YOUR SURGEON.                                                    FOLLOW-UP APPOINTMENTS:  If you do not already have a post-op appointment, please call the office for an appointment to be seen by your surgeon.  Guidelines for how soon to be seen are listed in your "After Visit Summary", but are typically between 2-3 weeks after surgery.  OTHER INSTRUCTIONS:    POST-OPERATIVE OPIOID TAPER INSTRUCTIONS: It is important to wean off of your opioid medication as soon as possible. If you do not need pain medication after your surgery it is ok to stop day one. Opioids include: Codeine, Hydrocodone(Norco, Vicodin), Oxycodone(Percocet, oxycontin) and hydromorphone amongst others.  Long term and even short term use of opiods can cause: Increased pain response Dependence Constipation Depression Respiratory depression And more.  Withdrawal symptoms can include Flu like symptoms Nausea, vomiting And more Techniques to manage these symptoms Hydrate well Eat regular healthy meals Stay active Use relaxation techniques(deep breathing, meditating, yoga) Do Not substitute Alcohol to help with tapering If you have been on opioids for less than two weeks and do not have pain than it is ok to stop all together.  Plan to wean off of opioids This plan should start within one week post op of your joint replacement. Maintain the same interval or time between taking each dose and first decrease the dose.  Cut the total daily intake of opioids by one tablet each day Next start to increase the time between doses. The last dose that should be eliminated is the evening dose.   MAKE SURE YOU:  Understand these instructions.  Get help right away if you are not doing well or get worse.    Thank you for letting us be a part of your medical care team.  It is a privilege we respect greatly.  We hope these instructions will help you stay on track for a fast and full recovery!           Signed: Yadiel Aubry A Alfonza Toft 05/20/2022,  7:12  AM

## 2022-05-20 NOTE — Progress Notes (Signed)
Pt given d/c instructions and all questions answered. Pt taken via wheelchair to front entrance by NT to meet ride.

## 2022-05-20 NOTE — Progress Notes (Signed)
     Subjective: Patient reports pain as well-controlled.  Did okay with therapy but felt woozy.  She thinks it is related to being tired but slept well overnight and hopeful to go home today.  Will reduce oxy to 2.5 to 5 mg every 4 hours PRN in case this is contributing.  Objective:   VITALS:   Vitals:   05/19/22 1345 05/19/22 2030 05/19/22 2031 05/20/22 0453  BP: (!) 91/54 (!) 112/44 (!) 113/54 (!) 91/52  Pulse: 91 92 90 91  Resp: 14 18  16   Temp: 98 F (36.7 C) 98.8 F (37.1 C)  99.1 F (37.3 C)  TempSrc:  Oral  Oral  SpO2: 96% 98%  93%  Weight:      Height:        Sensation intact distally Intact pulses distally Dorsiflexion/Plantar flexion intact Incision: dressing C/D/I Compartment soft    Lab Results  Component Value Date   WBC 8.0 05/20/2022   HGB 9.6 (L) 05/20/2022   HCT 29.3 (L) 05/20/2022   MCV 93.3 05/20/2022   PLT 180 05/20/2022   BMET    Component Value Date/Time   NA 134 (L) 05/19/2022 0433   K 4.0 05/19/2022 0433   CL 104 05/19/2022 0433   CO2 23 05/19/2022 0433   GLUCOSE 141 (H) 05/19/2022 0433   BUN 14 05/19/2022 0433   CREATININE 0.55 05/19/2022 0433   CALCIUM 8.4 (L) 05/19/2022 0433   GFRNONAA >60 05/19/2022 0433      Xray: THA components in good position no adverse features.  Assessment/Plan: 2 Days Post-Op   Principal Problem:   Osteoarthritis of right hip, unspecified osteoarthritis type  S/p R THA 05/18/22   Post op recs: WB: WBAT RLE, No formal hip precautions Abx: ancef Imaging: PACU pelvis Xray Dressing: Aquacell, keep intact until follow up DVT prophylaxis: Aspirin 81BID starting POD1 Follow up: 2 weeks after surgery for a wound check with Dr. Zachery Dakins at [facility].  Address: [address] [place], [place], [place] [address]  Office Phone: [phone]     Charlies Constable, MD Orthopedic Surgeon    Lawson Mahone A Stafford 05/20/2022, 7:09 AM   Charlies Constable, MD  Contact information:    [phone] 7am-5pm epic message Dr. Zachery Dakins, or call office for patient follow up: [phone] After hours and holidays please check [email or web address] for group call information for [facility]

## 2022-05-21 ENCOUNTER — Encounter (HOSPITAL_COMMUNITY): Payer: Self-pay | Admitting: Orthopedic Surgery

## 2022-05-21 DIAGNOSIS — Z981 Arthrodesis status: Secondary | ICD-10-CM | POA: Diagnosis not present

## 2022-05-21 DIAGNOSIS — Z791 Long term (current) use of non-steroidal anti-inflammatories (NSAID): Secondary | ICD-10-CM | POA: Diagnosis not present

## 2022-05-21 DIAGNOSIS — Z471 Aftercare following joint replacement surgery: Secondary | ICD-10-CM | POA: Diagnosis not present

## 2022-05-21 DIAGNOSIS — Z7982 Long term (current) use of aspirin: Secondary | ICD-10-CM | POA: Diagnosis not present

## 2022-05-21 DIAGNOSIS — K449 Diaphragmatic hernia without obstruction or gangrene: Secondary | ICD-10-CM | POA: Diagnosis not present

## 2022-05-21 DIAGNOSIS — Z79899 Other long term (current) drug therapy: Secondary | ICD-10-CM | POA: Diagnosis not present

## 2022-05-21 DIAGNOSIS — F32A Depression, unspecified: Secondary | ICD-10-CM | POA: Diagnosis not present

## 2022-05-21 DIAGNOSIS — Z9181 History of falling: Secondary | ICD-10-CM | POA: Diagnosis not present

## 2022-05-21 DIAGNOSIS — K5730 Diverticulosis of large intestine without perforation or abscess without bleeding: Secondary | ICD-10-CM | POA: Diagnosis not present

## 2022-05-21 DIAGNOSIS — K589 Irritable bowel syndrome without diarrhea: Secondary | ICD-10-CM | POA: Diagnosis not present

## 2022-05-21 DIAGNOSIS — M1990 Unspecified osteoarthritis, unspecified site: Secondary | ICD-10-CM | POA: Diagnosis not present

## 2022-05-21 DIAGNOSIS — R7303 Prediabetes: Secondary | ICD-10-CM | POA: Diagnosis not present

## 2022-05-21 DIAGNOSIS — Z96641 Presence of right artificial hip joint: Secondary | ICD-10-CM | POA: Diagnosis not present

## 2022-05-21 DIAGNOSIS — I10 Essential (primary) hypertension: Secondary | ICD-10-CM | POA: Diagnosis not present

## 2022-05-21 DIAGNOSIS — K219 Gastro-esophageal reflux disease without esophagitis: Secondary | ICD-10-CM | POA: Diagnosis not present

## 2022-05-21 DIAGNOSIS — Z87891 Personal history of nicotine dependence: Secondary | ICD-10-CM | POA: Diagnosis not present

## 2022-05-22 DIAGNOSIS — K219 Gastro-esophageal reflux disease without esophagitis: Secondary | ICD-10-CM | POA: Diagnosis not present

## 2022-05-22 DIAGNOSIS — Z9181 History of falling: Secondary | ICD-10-CM | POA: Diagnosis not present

## 2022-05-22 DIAGNOSIS — I10 Essential (primary) hypertension: Secondary | ICD-10-CM | POA: Diagnosis not present

## 2022-05-22 DIAGNOSIS — Z981 Arthrodesis status: Secondary | ICD-10-CM | POA: Diagnosis not present

## 2022-05-22 DIAGNOSIS — K589 Irritable bowel syndrome without diarrhea: Secondary | ICD-10-CM | POA: Diagnosis not present

## 2022-05-22 DIAGNOSIS — F32A Depression, unspecified: Secondary | ICD-10-CM | POA: Diagnosis not present

## 2022-05-22 DIAGNOSIS — Z96641 Presence of right artificial hip joint: Secondary | ICD-10-CM | POA: Diagnosis not present

## 2022-05-22 DIAGNOSIS — Z7982 Long term (current) use of aspirin: Secondary | ICD-10-CM | POA: Diagnosis not present

## 2022-05-22 DIAGNOSIS — Z471 Aftercare following joint replacement surgery: Secondary | ICD-10-CM | POA: Diagnosis not present

## 2022-05-22 DIAGNOSIS — Z791 Long term (current) use of non-steroidal anti-inflammatories (NSAID): Secondary | ICD-10-CM | POA: Diagnosis not present

## 2022-05-22 DIAGNOSIS — M1990 Unspecified osteoarthritis, unspecified site: Secondary | ICD-10-CM | POA: Diagnosis not present

## 2022-05-22 DIAGNOSIS — R7303 Prediabetes: Secondary | ICD-10-CM | POA: Diagnosis not present

## 2022-05-22 DIAGNOSIS — Z79899 Other long term (current) drug therapy: Secondary | ICD-10-CM | POA: Diagnosis not present

## 2022-05-22 DIAGNOSIS — K5730 Diverticulosis of large intestine without perforation or abscess without bleeding: Secondary | ICD-10-CM | POA: Diagnosis not present

## 2022-05-22 DIAGNOSIS — K449 Diaphragmatic hernia without obstruction or gangrene: Secondary | ICD-10-CM | POA: Diagnosis not present

## 2022-05-22 DIAGNOSIS — Z87891 Personal history of nicotine dependence: Secondary | ICD-10-CM | POA: Diagnosis not present

## 2022-05-24 DIAGNOSIS — Z981 Arthrodesis status: Secondary | ICD-10-CM | POA: Diagnosis not present

## 2022-05-24 DIAGNOSIS — R7303 Prediabetes: Secondary | ICD-10-CM | POA: Diagnosis not present

## 2022-05-24 DIAGNOSIS — I10 Essential (primary) hypertension: Secondary | ICD-10-CM | POA: Diagnosis not present

## 2022-05-24 DIAGNOSIS — Z471 Aftercare following joint replacement surgery: Secondary | ICD-10-CM | POA: Diagnosis not present

## 2022-05-24 DIAGNOSIS — M1990 Unspecified osteoarthritis, unspecified site: Secondary | ICD-10-CM | POA: Diagnosis not present

## 2022-05-24 DIAGNOSIS — Z791 Long term (current) use of non-steroidal anti-inflammatories (NSAID): Secondary | ICD-10-CM | POA: Diagnosis not present

## 2022-05-24 DIAGNOSIS — Z79899 Other long term (current) drug therapy: Secondary | ICD-10-CM | POA: Diagnosis not present

## 2022-05-24 DIAGNOSIS — Z87891 Personal history of nicotine dependence: Secondary | ICD-10-CM | POA: Diagnosis not present

## 2022-05-24 DIAGNOSIS — Z7982 Long term (current) use of aspirin: Secondary | ICD-10-CM | POA: Diagnosis not present

## 2022-05-24 DIAGNOSIS — K219 Gastro-esophageal reflux disease without esophagitis: Secondary | ICD-10-CM | POA: Diagnosis not present

## 2022-05-24 DIAGNOSIS — K5730 Diverticulosis of large intestine without perforation or abscess without bleeding: Secondary | ICD-10-CM | POA: Diagnosis not present

## 2022-05-24 DIAGNOSIS — F32A Depression, unspecified: Secondary | ICD-10-CM | POA: Diagnosis not present

## 2022-05-24 DIAGNOSIS — K589 Irritable bowel syndrome without diarrhea: Secondary | ICD-10-CM | POA: Diagnosis not present

## 2022-05-24 DIAGNOSIS — K449 Diaphragmatic hernia without obstruction or gangrene: Secondary | ICD-10-CM | POA: Diagnosis not present

## 2022-05-24 DIAGNOSIS — Z96641 Presence of right artificial hip joint: Secondary | ICD-10-CM | POA: Diagnosis not present

## 2022-05-24 DIAGNOSIS — Z9181 History of falling: Secondary | ICD-10-CM | POA: Diagnosis not present

## 2022-05-26 DIAGNOSIS — M1990 Unspecified osteoarthritis, unspecified site: Secondary | ICD-10-CM | POA: Diagnosis not present

## 2022-05-26 DIAGNOSIS — Z7982 Long term (current) use of aspirin: Secondary | ICD-10-CM | POA: Diagnosis not present

## 2022-05-26 DIAGNOSIS — K449 Diaphragmatic hernia without obstruction or gangrene: Secondary | ICD-10-CM | POA: Diagnosis not present

## 2022-05-26 DIAGNOSIS — Z791 Long term (current) use of non-steroidal anti-inflammatories (NSAID): Secondary | ICD-10-CM | POA: Diagnosis not present

## 2022-05-26 DIAGNOSIS — R7303 Prediabetes: Secondary | ICD-10-CM | POA: Diagnosis not present

## 2022-05-26 DIAGNOSIS — F32A Depression, unspecified: Secondary | ICD-10-CM | POA: Diagnosis not present

## 2022-05-26 DIAGNOSIS — K219 Gastro-esophageal reflux disease without esophagitis: Secondary | ICD-10-CM | POA: Diagnosis not present

## 2022-05-26 DIAGNOSIS — K5730 Diverticulosis of large intestine without perforation or abscess without bleeding: Secondary | ICD-10-CM | POA: Diagnosis not present

## 2022-05-26 DIAGNOSIS — I10 Essential (primary) hypertension: Secondary | ICD-10-CM | POA: Diagnosis not present

## 2022-05-26 DIAGNOSIS — K589 Irritable bowel syndrome without diarrhea: Secondary | ICD-10-CM | POA: Diagnosis not present

## 2022-05-26 DIAGNOSIS — Z981 Arthrodesis status: Secondary | ICD-10-CM | POA: Diagnosis not present

## 2022-05-26 DIAGNOSIS — Z87891 Personal history of nicotine dependence: Secondary | ICD-10-CM | POA: Diagnosis not present

## 2022-05-26 DIAGNOSIS — Z9181 History of falling: Secondary | ICD-10-CM | POA: Diagnosis not present

## 2022-05-26 DIAGNOSIS — Z471 Aftercare following joint replacement surgery: Secondary | ICD-10-CM | POA: Diagnosis not present

## 2022-05-26 DIAGNOSIS — Z96641 Presence of right artificial hip joint: Secondary | ICD-10-CM | POA: Diagnosis not present

## 2022-05-26 DIAGNOSIS — Z79899 Other long term (current) drug therapy: Secondary | ICD-10-CM | POA: Diagnosis not present

## 2022-05-28 DIAGNOSIS — K449 Diaphragmatic hernia without obstruction or gangrene: Secondary | ICD-10-CM | POA: Diagnosis not present

## 2022-05-28 DIAGNOSIS — K589 Irritable bowel syndrome without diarrhea: Secondary | ICD-10-CM | POA: Diagnosis not present

## 2022-05-28 DIAGNOSIS — Z471 Aftercare following joint replacement surgery: Secondary | ICD-10-CM | POA: Diagnosis not present

## 2022-05-28 DIAGNOSIS — Z79899 Other long term (current) drug therapy: Secondary | ICD-10-CM | POA: Diagnosis not present

## 2022-05-28 DIAGNOSIS — Z791 Long term (current) use of non-steroidal anti-inflammatories (NSAID): Secondary | ICD-10-CM | POA: Diagnosis not present

## 2022-05-28 DIAGNOSIS — I10 Essential (primary) hypertension: Secondary | ICD-10-CM | POA: Diagnosis not present

## 2022-05-28 DIAGNOSIS — K5730 Diverticulosis of large intestine without perforation or abscess without bleeding: Secondary | ICD-10-CM | POA: Diagnosis not present

## 2022-05-28 DIAGNOSIS — Z981 Arthrodesis status: Secondary | ICD-10-CM | POA: Diagnosis not present

## 2022-05-28 DIAGNOSIS — K219 Gastro-esophageal reflux disease without esophagitis: Secondary | ICD-10-CM | POA: Diagnosis not present

## 2022-05-28 DIAGNOSIS — Z87891 Personal history of nicotine dependence: Secondary | ICD-10-CM | POA: Diagnosis not present

## 2022-05-28 DIAGNOSIS — M1990 Unspecified osteoarthritis, unspecified site: Secondary | ICD-10-CM | POA: Diagnosis not present

## 2022-05-28 DIAGNOSIS — F32A Depression, unspecified: Secondary | ICD-10-CM | POA: Diagnosis not present

## 2022-05-28 DIAGNOSIS — R7303 Prediabetes: Secondary | ICD-10-CM | POA: Diagnosis not present

## 2022-05-28 DIAGNOSIS — Z96641 Presence of right artificial hip joint: Secondary | ICD-10-CM | POA: Diagnosis not present

## 2022-05-28 DIAGNOSIS — Z9181 History of falling: Secondary | ICD-10-CM | POA: Diagnosis not present

## 2022-05-28 DIAGNOSIS — Z7982 Long term (current) use of aspirin: Secondary | ICD-10-CM | POA: Diagnosis not present

## 2022-06-01 DIAGNOSIS — K5730 Diverticulosis of large intestine without perforation or abscess without bleeding: Secondary | ICD-10-CM | POA: Diagnosis not present

## 2022-06-01 DIAGNOSIS — I10 Essential (primary) hypertension: Secondary | ICD-10-CM | POA: Diagnosis not present

## 2022-06-01 DIAGNOSIS — Z9181 History of falling: Secondary | ICD-10-CM | POA: Diagnosis not present

## 2022-06-01 DIAGNOSIS — M1990 Unspecified osteoarthritis, unspecified site: Secondary | ICD-10-CM | POA: Diagnosis not present

## 2022-06-01 DIAGNOSIS — Z79899 Other long term (current) drug therapy: Secondary | ICD-10-CM | POA: Diagnosis not present

## 2022-06-01 DIAGNOSIS — Z96641 Presence of right artificial hip joint: Secondary | ICD-10-CM | POA: Diagnosis not present

## 2022-06-01 DIAGNOSIS — R7303 Prediabetes: Secondary | ICD-10-CM | POA: Diagnosis not present

## 2022-06-01 DIAGNOSIS — Z981 Arthrodesis status: Secondary | ICD-10-CM | POA: Diagnosis not present

## 2022-06-01 DIAGNOSIS — K449 Diaphragmatic hernia without obstruction or gangrene: Secondary | ICD-10-CM | POA: Diagnosis not present

## 2022-06-01 DIAGNOSIS — Z7982 Long term (current) use of aspirin: Secondary | ICD-10-CM | POA: Diagnosis not present

## 2022-06-01 DIAGNOSIS — K589 Irritable bowel syndrome without diarrhea: Secondary | ICD-10-CM | POA: Diagnosis not present

## 2022-06-01 DIAGNOSIS — F32A Depression, unspecified: Secondary | ICD-10-CM | POA: Diagnosis not present

## 2022-06-01 DIAGNOSIS — Z87891 Personal history of nicotine dependence: Secondary | ICD-10-CM | POA: Diagnosis not present

## 2022-06-01 DIAGNOSIS — Z791 Long term (current) use of non-steroidal anti-inflammatories (NSAID): Secondary | ICD-10-CM | POA: Diagnosis not present

## 2022-06-01 DIAGNOSIS — Z471 Aftercare following joint replacement surgery: Secondary | ICD-10-CM | POA: Diagnosis not present

## 2022-06-01 DIAGNOSIS — K219 Gastro-esophageal reflux disease without esophagitis: Secondary | ICD-10-CM | POA: Diagnosis not present

## 2022-06-02 DIAGNOSIS — M1611 Unilateral primary osteoarthritis, right hip: Secondary | ICD-10-CM | POA: Diagnosis not present

## 2022-06-03 DIAGNOSIS — Z981 Arthrodesis status: Secondary | ICD-10-CM | POA: Diagnosis not present

## 2022-06-03 DIAGNOSIS — K219 Gastro-esophageal reflux disease without esophagitis: Secondary | ICD-10-CM | POA: Diagnosis not present

## 2022-06-03 DIAGNOSIS — R7303 Prediabetes: Secondary | ICD-10-CM | POA: Diagnosis not present

## 2022-06-03 DIAGNOSIS — Z471 Aftercare following joint replacement surgery: Secondary | ICD-10-CM | POA: Diagnosis not present

## 2022-06-03 DIAGNOSIS — Z791 Long term (current) use of non-steroidal anti-inflammatories (NSAID): Secondary | ICD-10-CM | POA: Diagnosis not present

## 2022-06-03 DIAGNOSIS — Z96641 Presence of right artificial hip joint: Secondary | ICD-10-CM | POA: Diagnosis not present

## 2022-06-03 DIAGNOSIS — F32A Depression, unspecified: Secondary | ICD-10-CM | POA: Diagnosis not present

## 2022-06-03 DIAGNOSIS — Z79899 Other long term (current) drug therapy: Secondary | ICD-10-CM | POA: Diagnosis not present

## 2022-06-03 DIAGNOSIS — K449 Diaphragmatic hernia without obstruction or gangrene: Secondary | ICD-10-CM | POA: Diagnosis not present

## 2022-06-03 DIAGNOSIS — Z9181 History of falling: Secondary | ICD-10-CM | POA: Diagnosis not present

## 2022-06-03 DIAGNOSIS — Z7982 Long term (current) use of aspirin: Secondary | ICD-10-CM | POA: Diagnosis not present

## 2022-06-03 DIAGNOSIS — K5730 Diverticulosis of large intestine without perforation or abscess without bleeding: Secondary | ICD-10-CM | POA: Diagnosis not present

## 2022-06-03 DIAGNOSIS — K589 Irritable bowel syndrome without diarrhea: Secondary | ICD-10-CM | POA: Diagnosis not present

## 2022-06-03 DIAGNOSIS — Z87891 Personal history of nicotine dependence: Secondary | ICD-10-CM | POA: Diagnosis not present

## 2022-06-03 DIAGNOSIS — I10 Essential (primary) hypertension: Secondary | ICD-10-CM | POA: Diagnosis not present

## 2022-06-03 DIAGNOSIS — M1990 Unspecified osteoarthritis, unspecified site: Secondary | ICD-10-CM | POA: Diagnosis not present

## 2022-06-15 DIAGNOSIS — M25551 Pain in right hip: Secondary | ICD-10-CM | POA: Diagnosis not present

## 2022-06-15 DIAGNOSIS — M6281 Muscle weakness (generalized): Secondary | ICD-10-CM | POA: Diagnosis not present

## 2022-06-22 DIAGNOSIS — M25551 Pain in right hip: Secondary | ICD-10-CM | POA: Diagnosis not present

## 2022-06-22 DIAGNOSIS — M6281 Muscle weakness (generalized): Secondary | ICD-10-CM | POA: Diagnosis not present

## 2022-07-03 DIAGNOSIS — M1611 Unilateral primary osteoarthritis, right hip: Secondary | ICD-10-CM | POA: Diagnosis not present

## 2022-07-16 DIAGNOSIS — L309 Dermatitis, unspecified: Secondary | ICD-10-CM | POA: Diagnosis not present

## 2022-07-16 DIAGNOSIS — L218 Other seborrheic dermatitis: Secondary | ICD-10-CM | POA: Diagnosis not present

## 2022-07-16 DIAGNOSIS — L638 Other alopecia areata: Secondary | ICD-10-CM | POA: Diagnosis not present

## 2022-07-16 DIAGNOSIS — L650 Telogen effluvium: Secondary | ICD-10-CM | POA: Diagnosis not present

## 2022-07-31 DIAGNOSIS — H5711 Ocular pain, right eye: Secondary | ICD-10-CM | POA: Diagnosis not present

## 2022-08-11 DIAGNOSIS — L900 Lichen sclerosus et atrophicus: Secondary | ICD-10-CM | POA: Diagnosis not present

## 2022-08-11 DIAGNOSIS — N814 Uterovaginal prolapse, unspecified: Secondary | ICD-10-CM | POA: Diagnosis not present

## 2022-08-27 DIAGNOSIS — K080 Exfoliation of teeth due to systemic causes: Secondary | ICD-10-CM | POA: Diagnosis not present

## 2022-09-16 DIAGNOSIS — K080 Exfoliation of teeth due to systemic causes: Secondary | ICD-10-CM | POA: Diagnosis not present

## 2022-09-25 ENCOUNTER — Other Ambulatory Visit: Payer: Medicare Other

## 2022-09-28 ENCOUNTER — Ambulatory Visit: Payer: Medicare Other | Admitting: Family Medicine

## 2022-09-28 VITALS — BP 128/70 | Ht 62.0 in | Wt 132.0 lb

## 2022-09-28 DIAGNOSIS — M810 Age-related osteoporosis without current pathological fracture: Secondary | ICD-10-CM | POA: Diagnosis not present

## 2022-09-28 DIAGNOSIS — L309 Dermatitis, unspecified: Secondary | ICD-10-CM | POA: Diagnosis not present

## 2022-09-28 NOTE — Patient Instructions (Signed)
Get labs (vitamin D, basic metabolic panel) after you leave today. We will call you with the results and if they're normal - to come in for your prolia. It's important you get this shot every 6 months without a break between shots. We will order a bone density test for you as well since you're due.

## 2022-09-29 ENCOUNTER — Telehealth: Payer: Self-pay

## 2022-09-29 ENCOUNTER — Inpatient Hospital Stay: Admission: RE | Admit: 2022-09-29 | Payer: Medicare Other | Source: Ambulatory Visit

## 2022-09-29 LAB — VITAMIN D 25 HYDROXY (VIT D DEFICIENCY, FRACTURES): Vit D, 25-Hydroxy: 71.3 ng/mL (ref 30.0–100.0)

## 2022-09-29 NOTE — Telephone Encounter (Signed)
From: Annita Brod, CMA  Sent: 09/28/2022   2:46 PM EDT  To: Lenda Kelp, MD; Dierdre Searles, CMA   Can we start VOB for prolia for this pt....   We saw her today   thx
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
## 2022-10-01 DIAGNOSIS — F330 Major depressive disorder, recurrent, mild: Secondary | ICD-10-CM | POA: Diagnosis not present

## 2022-10-01 DIAGNOSIS — I700 Atherosclerosis of aorta: Secondary | ICD-10-CM | POA: Diagnosis not present

## 2022-10-01 DIAGNOSIS — J449 Chronic obstructive pulmonary disease, unspecified: Secondary | ICD-10-CM | POA: Diagnosis not present

## 2022-10-01 DIAGNOSIS — I10 Essential (primary) hypertension: Secondary | ICD-10-CM | POA: Diagnosis not present

## 2022-10-05 NOTE — Telephone Encounter (Signed)
Prolia VOB initiated via [email or web address]  Last OV:  Next OV:  Last Prolia inj:  Next Prolia inj DUE:   

## 2022-10-13 NOTE — Telephone Encounter (Signed)
Prior Auth required for PROLIA  PA PROCESS DETAILS: PA is required. Providers may call Medical Utilization at [phone] to initiate. Forms may be accessed online at [email or web address] & faxed back to [phone] .   

## 2022-10-20 NOTE — Telephone Encounter (Signed)
Prior Auth initiated for Mary Shiner  PA# [id number] Status: PENDING

## 2022-10-29 NOTE — Telephone Encounter (Signed)
[employer] PA approved 10/20/22 to 10/19/23. Auth #: [id number]

## 2022-10-29 NOTE — Telephone Encounter (Signed)
Pt ready for scheduling on or after 10/20/22  Out-of-pocket cost due at time of visit: $327  Primary: Medicare Prolia co-insurance: 20% (approximately $302) Admin fee co-insurance: 20% (approximately $25)  Deductible: does not apply  Prior Auth APPROVED PA# [id number] Valid: 10/20/22-10/20/23  Secondary: N/A Prolia co-insurance:  Admin fee co-insurance:  Deductible:  Prior Auth:  PA# Valid:   ** This summary of benefits is an estimation of the patient's out-of-pocket cost. Exact cost may vary based on individual plan coverage.

## 2022-10-29 NOTE — Telephone Encounter (Addendum)
APPROVED  PA# [id number] Valid: 10/20/22-10/20/23

## 2022-11-02 ENCOUNTER — Ambulatory Visit: Payer: Medicare Other | Admitting: Family Medicine

## 2022-11-02 VITALS — Ht 62.0 in

## 2022-11-02 DIAGNOSIS — M810 Age-related osteoporosis without current pathological fracture: Secondary | ICD-10-CM

## 2022-11-02 MED ORDER — DENOSUMAB 60 MG/ML ~~LOC~~ SOSY
60.0000 mg | PREFILLED_SYRINGE | Freq: Once | SUBCUTANEOUS | Status: AC
  Administered 2022-11-02: 60 mg via SUBCUTANEOUS

## 2022-11-02 NOTE — Progress Notes (Signed)
 Patient given [place] prolia injection 60mg /ml in the right arm. Patient tolerated injection well without reaction at the injection site. Patient will schedule next injection, which is 6 months from today.

## 2022-11-12 NOTE — Telephone Encounter (Signed)
Last Prolia inj 11/02/22 Next Prolia inj due 05/06/23

## 2022-11-19 ENCOUNTER — Other Ambulatory Visit: Payer: Self-pay | Admitting: Internal Medicine

## 2022-11-19 DIAGNOSIS — Z1231 Encounter for screening mammogram for malignant neoplasm of breast: Secondary | ICD-10-CM

## 2022-11-25 DIAGNOSIS — H0102A Squamous blepharitis right eye, upper and lower eyelids: Secondary | ICD-10-CM | POA: Diagnosis not present

## 2022-11-25 DIAGNOSIS — Z961 Presence of intraocular lens: Secondary | ICD-10-CM | POA: Diagnosis not present

## 2022-11-25 DIAGNOSIS — H0102B Squamous blepharitis left eye, upper and lower eyelids: Secondary | ICD-10-CM | POA: Diagnosis not present

## 2022-11-25 DIAGNOSIS — H04123 Dry eye syndrome of bilateral lacrimal glands: Secondary | ICD-10-CM | POA: Diagnosis not present

## 2022-11-25 DIAGNOSIS — H40013 Open angle with borderline findings, low risk, bilateral: Secondary | ICD-10-CM | POA: Diagnosis not present

## 2022-12-24 DIAGNOSIS — L609 Nail disorder, unspecified: Secondary | ICD-10-CM | POA: Diagnosis not present

## 2022-12-24 DIAGNOSIS — F325 Major depressive disorder, single episode, in full remission: Secondary | ICD-10-CM | POA: Diagnosis not present

## 2023-01-01 ENCOUNTER — Ambulatory Visit
Admission: RE | Admit: 2023-01-01 | Discharge: 2023-01-01 | Disposition: A | Discharge status: Home / Self Care | Payer: Medicare Other | Source: Ambulatory Visit | Attending: Internal Medicine | Admitting: Internal Medicine

## 2023-01-01 DIAGNOSIS — Z1231 Encounter for screening mammogram for malignant neoplasm of breast: Secondary | ICD-10-CM | POA: Diagnosis not present

## 2023-02-02 DIAGNOSIS — L03012 Cellulitis of left finger: Secondary | ICD-10-CM | POA: Diagnosis not present

## 2023-02-02 DIAGNOSIS — L603 Nail dystrophy: Secondary | ICD-10-CM | POA: Diagnosis not present

## 2023-02-02 DIAGNOSIS — L03011 Cellulitis of right finger: Secondary | ICD-10-CM | POA: Diagnosis not present

## 2023-03-04 DIAGNOSIS — R0981 Nasal congestion: Secondary | ICD-10-CM | POA: Diagnosis not present

## 2023-03-04 DIAGNOSIS — H9203 Otalgia, bilateral: Secondary | ICD-10-CM | POA: Diagnosis not present

## 2023-03-04 DIAGNOSIS — R051 Acute cough: Secondary | ICD-10-CM | POA: Diagnosis not present

## 2023-03-08 DIAGNOSIS — F325 Major depressive disorder, single episode, in full remission: Secondary | ICD-10-CM | POA: Diagnosis not present

## 2023-03-08 DIAGNOSIS — R0989 Other specified symptoms and signs involving the circulatory and respiratory systems: Secondary | ICD-10-CM | POA: Diagnosis not present

## 2023-03-08 DIAGNOSIS — R059 Cough, unspecified: Secondary | ICD-10-CM | POA: Diagnosis not present

## 2023-04-05 DIAGNOSIS — R7303 Prediabetes: Secondary | ICD-10-CM | POA: Diagnosis not present

## 2023-04-05 DIAGNOSIS — Z1331 Encounter for screening for depression: Secondary | ICD-10-CM | POA: Diagnosis not present

## 2023-04-05 DIAGNOSIS — M810 Age-related osteoporosis without current pathological fracture: Secondary | ICD-10-CM | POA: Diagnosis not present

## 2023-04-05 DIAGNOSIS — I10 Essential (primary) hypertension: Secondary | ICD-10-CM | POA: Diagnosis not present

## 2023-04-05 DIAGNOSIS — J449 Chronic obstructive pulmonary disease, unspecified: Secondary | ICD-10-CM | POA: Diagnosis not present

## 2023-04-05 DIAGNOSIS — Z23 Encounter for immunization: Secondary | ICD-10-CM | POA: Diagnosis not present

## 2023-04-05 DIAGNOSIS — Z0000 Encounter for general adult medical examination without abnormal findings: Secondary | ICD-10-CM | POA: Diagnosis not present

## 2023-04-05 DIAGNOSIS — I700 Atherosclerosis of aorta: Secondary | ICD-10-CM | POA: Diagnosis not present

## 2023-04-05 DIAGNOSIS — F331 Major depressive disorder, recurrent, moderate: Secondary | ICD-10-CM | POA: Diagnosis not present

## 2023-04-05 LAB — LAB REPORT - SCANNED
A1c: 5.8
EGFR: 89

## 2023-04-10 NOTE — Telephone Encounter (Signed)
Prolia VOB initiated via [email or web address]  Next Prolia inj DUE: 05/06/23

## 2023-04-22 NOTE — Telephone Encounter (Addendum)
 Patient is ready for scheduling on or after: 05/06/23  Out-of-pocket cost due at time of visit: $323.52  Primary: [employer] Medicare Prolia  co-insurance: 20% (approximately $323.52) Admin fee co-insurance: $0  Deductible: N/A  Prior Auth: Not required   ** This summary of benefits is an estimation of the patient's out-of-pocket cost. Exact cost may vary based on individual plan coverage.

## 2023-05-06 ENCOUNTER — Ambulatory Visit: Payer: Medicare Other | Admitting: Family Medicine

## 2023-05-06 VITALS — Ht 62.0 in

## 2023-05-06 DIAGNOSIS — M810 Age-related osteoporosis without current pathological fracture: Secondary | ICD-10-CM

## 2023-05-06 MED ORDER — DENOSUMAB 60 MG/ML ~~LOC~~ SOSY
60.0000 mg | PREFILLED_SYRINGE | Freq: Once | SUBCUTANEOUS | Status: AC
  Administered 2023-05-06: 60 mg via SUBCUTANEOUS

## 2023-05-06 NOTE — Telephone Encounter (Signed)
Last Prolia inj 05/06/23 Next Prolia inj due 11/04/23

## 2023-05-06 NOTE — Progress Notes (Signed)
 Patient given [place] prolia injection 60mg /ml in the right arm. Patient tolerated injection well without reaction at the injection site. Patient will schedule next injection, which is 6 months from today.

## 2023-05-07 LAB — VITAMIN D 25 HYDROXY (VIT D DEFICIENCY, FRACTURES): Vit D, 25-Hydroxy: 66.9 ng/mL (ref 30.0–100.0)

## 2023-05-18 ENCOUNTER — Encounter: Payer: Self-pay | Admitting: Internal Medicine

## 2023-05-31 DIAGNOSIS — R509 Fever, unspecified: Secondary | ICD-10-CM | POA: Diagnosis not present

## 2023-05-31 DIAGNOSIS — H9201 Otalgia, right ear: Secondary | ICD-10-CM | POA: Diagnosis not present

## 2023-05-31 DIAGNOSIS — R051 Acute cough: Secondary | ICD-10-CM | POA: Diagnosis not present

## 2023-06-08 DIAGNOSIS — K080 Exfoliation of teeth due to systemic causes: Secondary | ICD-10-CM | POA: Diagnosis not present

## 2023-06-16 DIAGNOSIS — L03011 Cellulitis of right finger: Secondary | ICD-10-CM | POA: Diagnosis not present

## 2023-06-16 DIAGNOSIS — L603 Nail dystrophy: Secondary | ICD-10-CM | POA: Diagnosis not present

## 2023-06-16 DIAGNOSIS — L2989 Other pruritus: Secondary | ICD-10-CM | POA: Diagnosis not present

## 2023-06-16 DIAGNOSIS — L03012 Cellulitis of left finger: Secondary | ICD-10-CM | POA: Diagnosis not present

## 2023-06-16 DIAGNOSIS — L820 Inflamed seborrheic keratosis: Secondary | ICD-10-CM | POA: Diagnosis not present

## 2023-08-03 DIAGNOSIS — W109XXA Fall (on) (from) unspecified stairs and steps, initial encounter: Secondary | ICD-10-CM | POA: Diagnosis not present

## 2023-08-03 DIAGNOSIS — S299XXA Unspecified injury of thorax, initial encounter: Secondary | ICD-10-CM | POA: Diagnosis not present

## 2023-08-03 DIAGNOSIS — M549 Dorsalgia, unspecified: Secondary | ICD-10-CM | POA: Diagnosis not present

## 2023-08-03 DIAGNOSIS — Z043 Encounter for examination and observation following other accident: Secondary | ICD-10-CM | POA: Diagnosis not present

## 2023-08-03 DIAGNOSIS — M546 Pain in thoracic spine: Secondary | ICD-10-CM | POA: Diagnosis not present

## 2023-08-03 DIAGNOSIS — R0781 Pleurodynia: Secondary | ICD-10-CM | POA: Diagnosis not present

## 2023-08-03 DIAGNOSIS — S2231XA Fracture of one rib, right side, initial encounter for closed fracture: Secondary | ICD-10-CM | POA: Diagnosis not present

## 2023-08-03 DIAGNOSIS — Y9301 Activity, walking, marching and hiking: Secondary | ICD-10-CM | POA: Diagnosis not present

## 2023-08-03 DIAGNOSIS — R0789 Other chest pain: Secondary | ICD-10-CM | POA: Diagnosis not present

## 2023-08-03 DIAGNOSIS — S3992XA Unspecified injury of lower back, initial encounter: Secondary | ICD-10-CM | POA: Diagnosis not present

## 2023-08-09 DIAGNOSIS — S32000A Wedge compression fracture of unspecified lumbar vertebra, initial encounter for closed fracture: Secondary | ICD-10-CM | POA: Diagnosis not present

## 2023-08-09 DIAGNOSIS — M519 Unspecified thoracic, thoracolumbar and lumbosacral intervertebral disc disorder: Secondary | ICD-10-CM | POA: Diagnosis not present

## 2023-08-09 DIAGNOSIS — M810 Age-related osteoporosis without current pathological fracture: Secondary | ICD-10-CM | POA: Diagnosis not present

## 2023-08-09 DIAGNOSIS — K649 Unspecified hemorrhoids: Secondary | ICD-10-CM | POA: Diagnosis not present

## 2023-08-12 DIAGNOSIS — M545 Low back pain, unspecified: Secondary | ICD-10-CM | POA: Diagnosis not present

## 2023-08-19 DIAGNOSIS — L900 Lichen sclerosus et atrophicus: Secondary | ICD-10-CM | POA: Diagnosis not present

## 2023-08-19 DIAGNOSIS — N9089 Other specified noninflammatory disorders of vulva and perineum: Secondary | ICD-10-CM | POA: Diagnosis not present

## 2023-08-19 DIAGNOSIS — N898 Other specified noninflammatory disorders of vagina: Secondary | ICD-10-CM | POA: Diagnosis not present

## 2023-08-19 DIAGNOSIS — Z01419 Encounter for gynecological examination (general) (routine) without abnormal findings: Secondary | ICD-10-CM | POA: Diagnosis not present

## 2023-10-06 DIAGNOSIS — F331 Major depressive disorder, recurrent, moderate: Secondary | ICD-10-CM | POA: Diagnosis not present

## 2023-10-06 DIAGNOSIS — I10 Essential (primary) hypertension: Secondary | ICD-10-CM | POA: Diagnosis not present

## 2023-10-06 DIAGNOSIS — M810 Age-related osteoporosis without current pathological fracture: Secondary | ICD-10-CM | POA: Diagnosis not present

## 2023-10-06 DIAGNOSIS — E785 Hyperlipidemia, unspecified: Secondary | ICD-10-CM | POA: Diagnosis not present

## 2023-10-14 DIAGNOSIS — I10 Essential (primary) hypertension: Secondary | ICD-10-CM | POA: Diagnosis not present

## 2023-10-14 DIAGNOSIS — J449 Chronic obstructive pulmonary disease, unspecified: Secondary | ICD-10-CM | POA: Diagnosis not present

## 2023-10-18 NOTE — Telephone Encounter (Signed)
 [employer]  Patient is ready for scheduling on or after: 11/04/23  Out-of-pocket cost due at time of visit: $333.71  Primary: [employer] [place] Prolia  co-insurance: 20% (approximately $333.71) Admin fee co-insurance: Covered at 100%  Deductible: N/A  Prior Auth: NOT required  Prolia  will be subject to a 20.0% coinsurance up to a $3,150.00 out of pocket max ($571.52 met).    ** This summary of benefits is an estimation of the patient's out-of-pocket cost. Exact cost may vary based on individual plan coverage.

## 2023-10-19 ENCOUNTER — Other Ambulatory Visit: Payer: Self-pay | Admitting: *Deleted

## 2023-10-19 DIAGNOSIS — M810 Age-related osteoporosis without current pathological fracture: Secondary | ICD-10-CM

## 2023-10-25 DIAGNOSIS — M810 Age-related osteoporosis without current pathological fracture: Secondary | ICD-10-CM | POA: Diagnosis not present

## 2023-10-26 ENCOUNTER — Ambulatory Visit: Payer: Self-pay | Admitting: Family Medicine

## 2023-10-26 LAB — BASIC METABOLIC PANEL WITH GFR
BUN/Creatinine Ratio: 24 (ref 12–28)
BUN: 17 mg/dL (ref 8–27)
CO2: 24 mmol/L (ref 20–29)
Calcium: 10.9 mg/dL — ABNORMAL HIGH (ref 8.7–10.3)
Chloride: 103 mmol/L (ref 96–106)
Creatinine, Ser: 0.7 mg/dL (ref 0.57–1.00)
Glucose: 78 mg/dL (ref 70–99)
Potassium: 5.2 mmol/L (ref 3.5–5.2)
Sodium: 142 mmol/L (ref 134–144)
eGFR: 87 mL/min/1.73 (ref >59)

## 2023-10-26 LAB — VITAMIN D 25 HYDROXY (VIT D DEFICIENCY, FRACTURES): Vit D, 25-Hydroxy: 64.5 ng/mL (ref 30.0–100.0)

## 2023-11-04 ENCOUNTER — Ambulatory Visit: Payer: Medicare Other | Admitting: Family Medicine

## 2023-11-04 ENCOUNTER — Other Ambulatory Visit: Payer: Self-pay | Admitting: *Deleted

## 2023-11-04 DIAGNOSIS — M810 Age-related osteoporosis without current pathological fracture: Secondary | ICD-10-CM

## 2023-11-04 MED ORDER — DENOSUMAB 60 MG/ML ~~LOC~~ SOSY
60.0000 mg | PREFILLED_SYRINGE | Freq: Once | SUBCUTANEOUS | Status: AC
  Administered 2023-11-04: 60 mg via SUBCUTANEOUS

## 2023-11-04 NOTE — Progress Notes (Unsigned)
 Patient given [place] prolia injection 60mg /ml in the right arm. Patient tolerated injection well without reaction at the injection site. Patient will schedule next injection, which is 6 months from today.

## 2023-11-06 LAB — CALCIUM, IONIZED: Calcium, Ion: 5.4 mg/dL (ref 4.5–5.6)

## 2023-11-06 LAB — PARATHYROID HORMONE, INTACT (NO CA): PTH: 31 pg/mL (ref 15–65)

## 2023-11-09 ENCOUNTER — Ambulatory Visit: Payer: Self-pay | Admitting: Family Medicine

## 2023-11-16 DIAGNOSIS — I10 Essential (primary) hypertension: Secondary | ICD-10-CM | POA: Diagnosis not present

## 2023-11-16 DIAGNOSIS — J449 Chronic obstructive pulmonary disease, unspecified: Secondary | ICD-10-CM | POA: Diagnosis not present

## 2023-11-18 DIAGNOSIS — F331 Major depressive disorder, recurrent, moderate: Secondary | ICD-10-CM | POA: Diagnosis not present

## 2023-11-18 DIAGNOSIS — M810 Age-related osteoporosis without current pathological fracture: Secondary | ICD-10-CM | POA: Diagnosis not present

## 2023-11-18 DIAGNOSIS — J449 Chronic obstructive pulmonary disease, unspecified: Secondary | ICD-10-CM | POA: Diagnosis not present

## 2023-11-18 DIAGNOSIS — I10 Essential (primary) hypertension: Secondary | ICD-10-CM | POA: Diagnosis not present

## 2023-12-06 DIAGNOSIS — H40013 Open angle with borderline findings, low risk, bilateral: Secondary | ICD-10-CM | POA: Diagnosis not present

## 2023-12-06 DIAGNOSIS — Z961 Presence of intraocular lens: Secondary | ICD-10-CM | POA: Diagnosis not present

## 2023-12-06 DIAGNOSIS — H0102A Squamous blepharitis right eye, upper and lower eyelids: Secondary | ICD-10-CM | POA: Diagnosis not present

## 2023-12-06 DIAGNOSIS — H0102B Squamous blepharitis left eye, upper and lower eyelids: Secondary | ICD-10-CM | POA: Diagnosis not present

## 2023-12-14 DIAGNOSIS — L03012 Cellulitis of left finger: Secondary | ICD-10-CM | POA: Diagnosis not present

## 2023-12-14 DIAGNOSIS — L03011 Cellulitis of right finger: Secondary | ICD-10-CM | POA: Diagnosis not present

## 2023-12-14 DIAGNOSIS — L638 Other alopecia areata: Secondary | ICD-10-CM | POA: Diagnosis not present

## 2023-12-14 DIAGNOSIS — L603 Nail dystrophy: Secondary | ICD-10-CM | POA: Diagnosis not present

## 2023-12-15 ENCOUNTER — Other Ambulatory Visit: Payer: Self-pay | Admitting: Internal Medicine

## 2023-12-15 DIAGNOSIS — Z1231 Encounter for screening mammogram for malignant neoplasm of breast: Secondary | ICD-10-CM

## 2023-12-16 DIAGNOSIS — J449 Chronic obstructive pulmonary disease, unspecified: Secondary | ICD-10-CM | POA: Diagnosis not present

## 2023-12-16 DIAGNOSIS — I10 Essential (primary) hypertension: Secondary | ICD-10-CM | POA: Diagnosis not present

## 2023-12-19 DIAGNOSIS — I10 Essential (primary) hypertension: Secondary | ICD-10-CM | POA: Diagnosis not present

## 2023-12-19 DIAGNOSIS — J449 Chronic obstructive pulmonary disease, unspecified: Secondary | ICD-10-CM | POA: Diagnosis not present

## 2023-12-19 DIAGNOSIS — M810 Age-related osteoporosis without current pathological fracture: Secondary | ICD-10-CM | POA: Diagnosis not present

## 2023-12-19 DIAGNOSIS — F331 Major depressive disorder, recurrent, moderate: Secondary | ICD-10-CM | POA: Diagnosis not present

## 2024-01-03 ENCOUNTER — Ambulatory Visit

## 2024-01-10 ENCOUNTER — Inpatient Hospital Stay
Admission: RE | Admit: 2024-01-10 | Discharge: 2024-01-10 | Disposition: A | Discharge status: Home / Self Care | Source: Ambulatory Visit | Attending: Internal Medicine | Admitting: Internal Medicine

## 2024-01-10 DIAGNOSIS — Z1231 Encounter for screening mammogram for malignant neoplasm of breast: Secondary | ICD-10-CM | POA: Diagnosis not present

## 2024-01-15 DIAGNOSIS — J449 Chronic obstructive pulmonary disease, unspecified: Secondary | ICD-10-CM | POA: Diagnosis not present

## 2024-01-15 DIAGNOSIS — I10 Essential (primary) hypertension: Secondary | ICD-10-CM | POA: Diagnosis not present

## 2024-01-18 DIAGNOSIS — I10 Essential (primary) hypertension: Secondary | ICD-10-CM | POA: Diagnosis not present

## 2024-01-18 DIAGNOSIS — F331 Major depressive disorder, recurrent, moderate: Secondary | ICD-10-CM | POA: Diagnosis not present

## 2024-01-18 DIAGNOSIS — M810 Age-related osteoporosis without current pathological fracture: Secondary | ICD-10-CM | POA: Diagnosis not present

## 2024-01-18 DIAGNOSIS — J449 Chronic obstructive pulmonary disease, unspecified: Secondary | ICD-10-CM | POA: Diagnosis not present

## 2024-03-01 DIAGNOSIS — M1611 Unilateral primary osteoarthritis, right hip: Secondary | ICD-10-CM | POA: Diagnosis not present

## 2024-03-01 DIAGNOSIS — I10 Essential (primary) hypertension: Secondary | ICD-10-CM | POA: Diagnosis not present

## 2024-03-01 DIAGNOSIS — L609 Nail disorder, unspecified: Secondary | ICD-10-CM | POA: Diagnosis not present

## 2024-03-01 DIAGNOSIS — Z789 Other specified health status: Secondary | ICD-10-CM | POA: Diagnosis not present

## 2024-04-07 NOTE — Telephone Encounter (Signed)
 2026 Prolia  benefits due to re-verify on [employer].

## 2024-04-24 NOTE — Telephone Encounter (Addendum)
 [employer]  Patient is ready for scheduling on or after: 05/09/24  Out-of-pocket cost due at time of visit: $353  Primary: BC[employer] Medicare Prolia  co-insurance: 20% (approximately $353) Admin fee co-insurance: 20% (approximately $0) OOP max: $4200, $ 0 met  Deductible: n/a  Prior Auth: Approved Case #: [id number] Valid: 04/28/24 - 04/28/25     ** This summary of benefits is an estimation of the patient's out-of-pocket cost. Exact cost may vary based on individual plan coverage.

## 2024-05-04 ENCOUNTER — Telehealth: Payer: Self-pay | Admitting: *Deleted

## 2024-05-04 NOTE — Telephone Encounter (Signed)
Patient archived in [employer] portal.

## 2024-05-04 NOTE — Telephone Encounter (Signed)
 See 05/04/24 tele encounter. Patient moved. Pt archived in [employer] portal.

## 2024-05-04 NOTE — Telephone Encounter (Signed)
-----   Message from [facility] Darden M sent at 05/04/2024  2:50 PM EST ----- Regarding: RE: just an FYI Oh ok  I will let Sabria Florido know so she can archive her then  thx ----- Message ----- From: Lera Andrea CROME Sent: 05/04/2024   2:02 PM EST To: Neeton C Moore, CMA Subject: just an FYI                                    Pt has moved to [place] so won't be able to come to [place] for her prolia . She is going to find a Doctor out that way.

## 2024-05-09 ENCOUNTER — Ambulatory Visit: Admitting: Family Medicine
